# Patient Record
Sex: Male | Born: 1960
Health system: Southern US, Community
[De-identification: ages and names within clinical notes are randomized; demographics above are authoritative.]

## PROBLEM LIST (undated history)

## (undated) DIAGNOSIS — K219 Gastro-esophageal reflux disease without esophagitis: Secondary | ICD-10-CM

## (undated) DIAGNOSIS — E785 Hyperlipidemia, unspecified: Secondary | ICD-10-CM

## (undated) DIAGNOSIS — I10 Essential (primary) hypertension: Secondary | ICD-10-CM

## (undated) HISTORY — PX: OTHER SURGICAL HISTORY: SHX169

## (undated) HISTORY — DX: Gastro-esophageal reflux disease without esophagitis: K21.9

## (undated) HISTORY — DX: Essential (primary) hypertension: I10

## (undated) HISTORY — DX: Hyperlipidemia, unspecified: E78.5

---

## 2000-08-14 ENCOUNTER — Ambulatory Visit (HOSPITAL_COMMUNITY): Admission: RE | Admit: 2000-08-14 | Discharge: 2000-08-14 | Payer: Self-pay | Admitting: Unknown Physician Specialty

## 2000-08-14 ENCOUNTER — Encounter: Payer: Self-pay | Admitting: Unknown Physician Specialty

## 2001-06-22 ENCOUNTER — Emergency Department (HOSPITAL_COMMUNITY): Admission: EM | Admit: 2001-06-22 | Discharge: 2001-06-22 | Payer: Self-pay | Admitting: Emergency Medicine

## 2007-03-11 ENCOUNTER — Ambulatory Visit (HOSPITAL_COMMUNITY): Admission: RE | Admit: 2007-03-11 | Discharge: 2007-03-11 | Payer: Self-pay | Admitting: Family Medicine
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
## 2012-06-04 ENCOUNTER — Encounter: Payer: Self-pay | Admitting: Cardiology

## 2012-06-26 ENCOUNTER — Institutional Professional Consult (permissible substitution): Payer: Self-pay | Admitting: Cardiology

## 2013-06-23 IMAGING — CR DG CHEST 2V
2 series · 2 of 2 positions shown · non-contrast
Comparison: None.

CLINICAL DATA: Shortness of breath

CHEST - 2 VIEW

[view not recorded (1 of 2)]
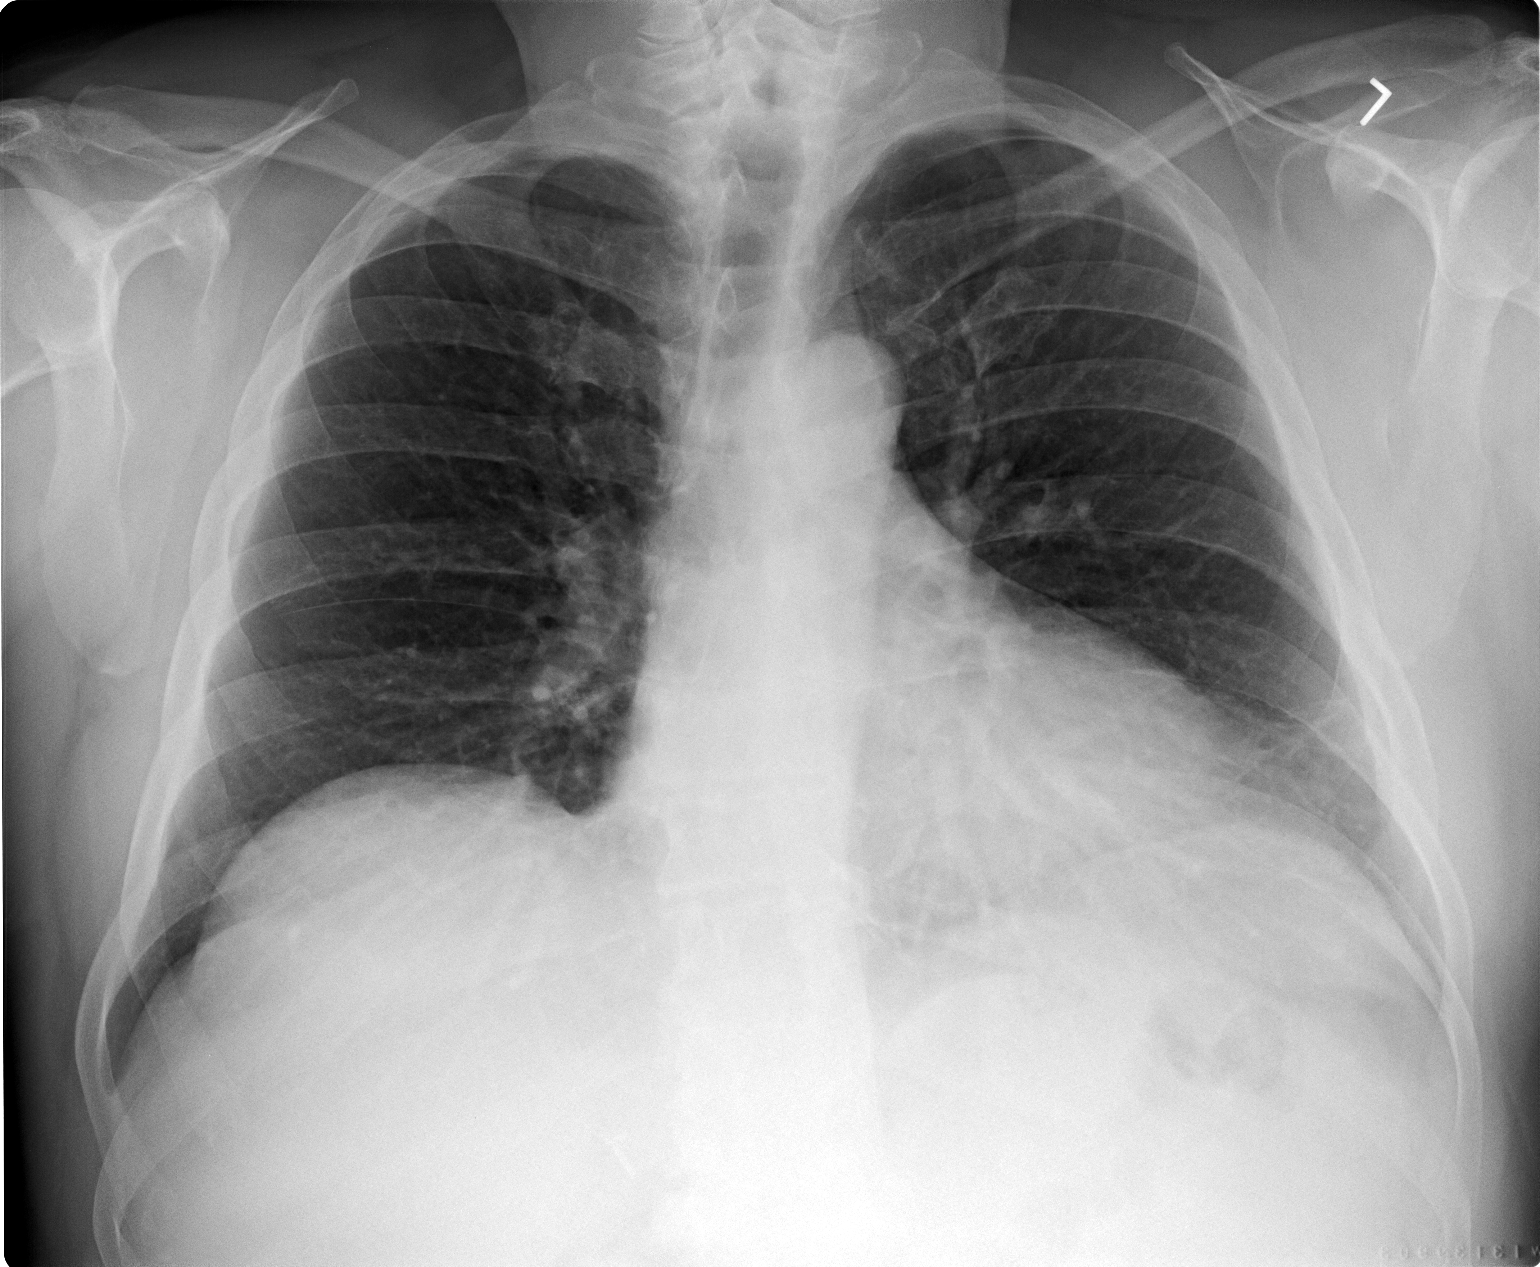

[view not recorded (2 of 2)]
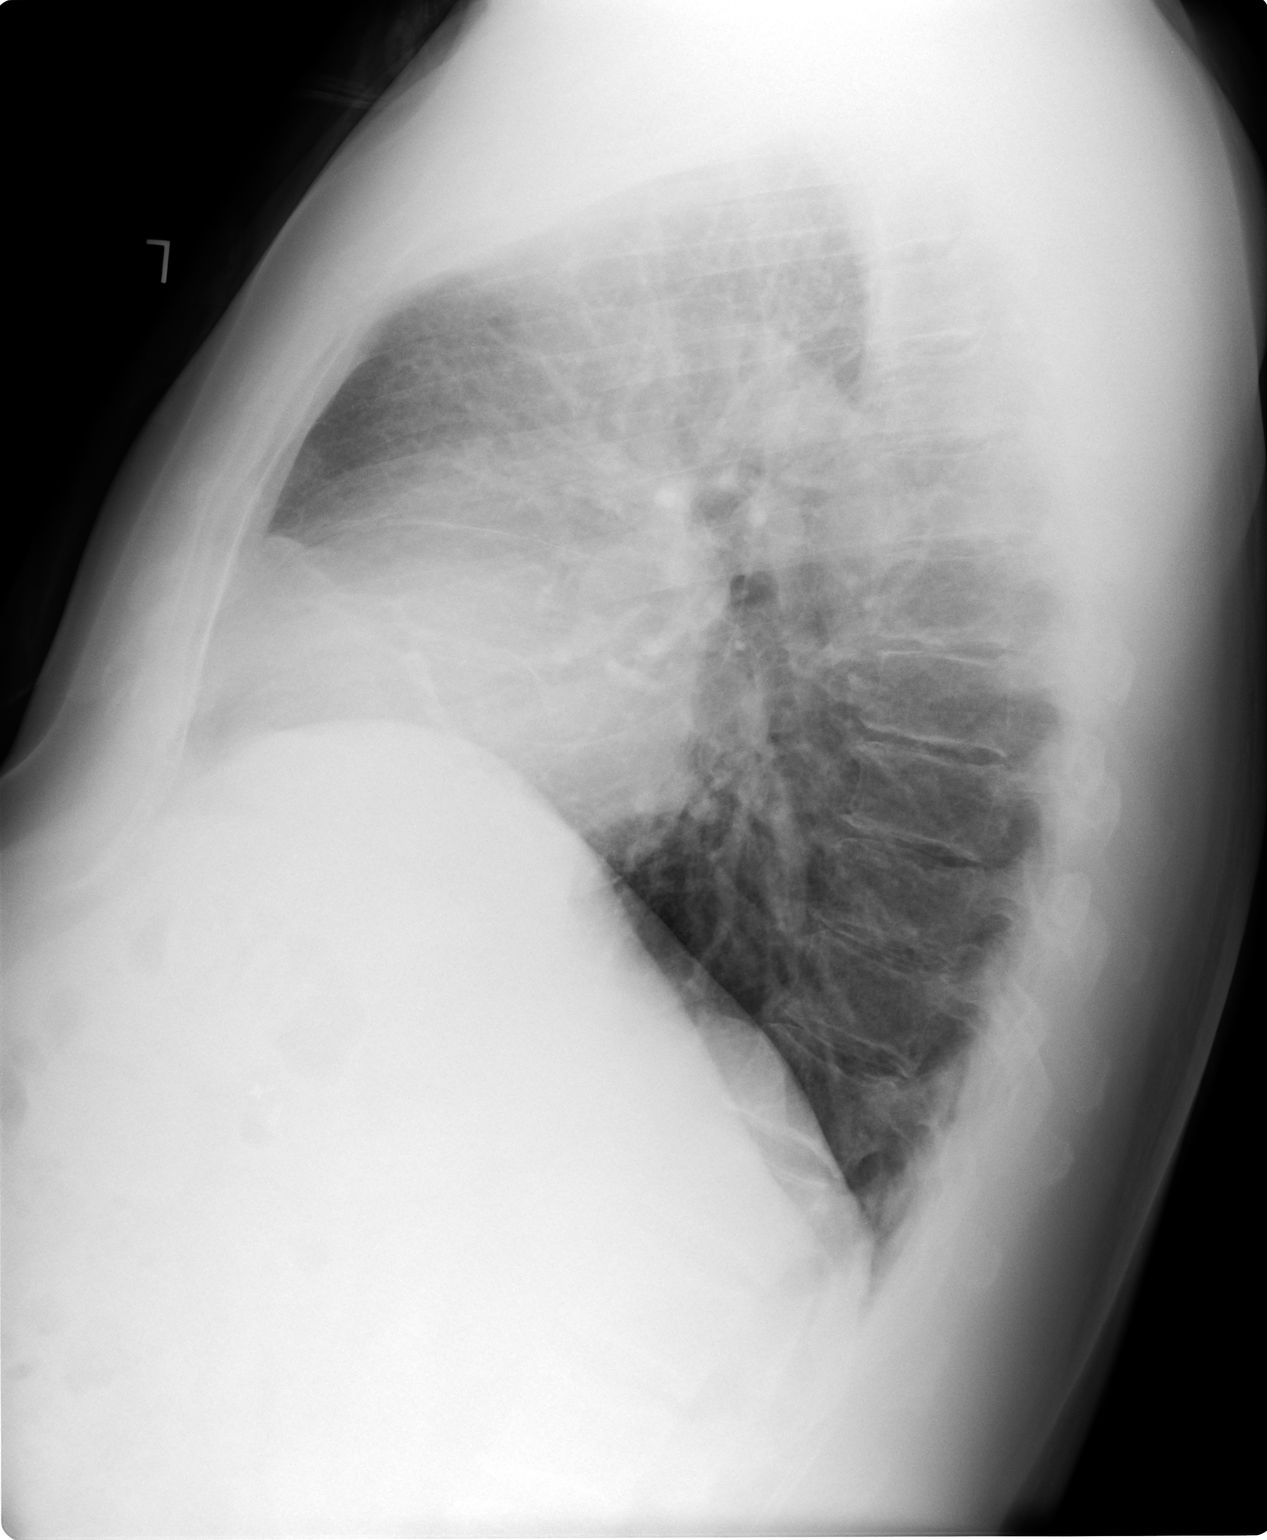

[2 of 2 positions shown; findings below may reference images not displayed]

FINDINGS: There is slight scarring in the left base.  Lungs are
otherwise clear.  Heart size and pulmonary vascularity are normal.
No adenopathy.  No bone lesions.
IMPRESSION: No edema or consolidation.

## 2015-06-01 ENCOUNTER — Encounter: Payer: Self-pay | Admitting: Family Medicine

## 2015-06-01 ENCOUNTER — Ambulatory Visit (INDEPENDENT_AMBULATORY_CARE_PROVIDER_SITE_OTHER): Payer: BLUE CROSS/BLUE SHIELD | Admitting: Family Medicine

## 2015-06-01 VITALS — BP 131/83 | HR 68 | Temp 98.6°F | Ht 70.0 in | Wt 193.0 lb

## 2015-06-01 DIAGNOSIS — J0100 Acute maxillary sinusitis, unspecified: Secondary | ICD-10-CM | POA: Diagnosis not present

## 2015-06-01 DIAGNOSIS — R05 Cough: Secondary | ICD-10-CM

## 2015-06-01 DIAGNOSIS — R059 Cough, unspecified: Secondary | ICD-10-CM

## 2015-06-01 LAB — POCT INFLUENZA A/B
INFLUENZA A, POC: NEGATIVE
Influenza B, POC: NEGATIVE

## 2015-06-01 MED ORDER — AMOXICILLIN-POT CLAVULANATE 875-125 MG PO TABS: 1.0000 | ORAL_TABLET | Freq: Two times a day (BID) | ORAL | Status: DC

## 2015-06-01 MED ORDER — PSEUDOEPHEDRINE-GUAIFENESIN ER 120-1200 MG PO TB12: 1.0000 | ORAL_TABLET | Freq: Two times a day (BID) | ORAL | Status: DC

## 2015-06-01 MED ORDER — BENZONATATE 200 MG PO CAPS: 200.0000 mg | ORAL_CAPSULE | Freq: Three times a day (TID) | ORAL | Status: DC | PRN
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
## 2015-06-04 ENCOUNTER — Telehealth: Payer: Self-pay | Admitting: Family Medicine

## 2015-06-04 NOTE — Telephone Encounter (Signed)
Seen stacks on 10th - is it ok to approve this many days?

## 2015-06-04 NOTE — Telephone Encounter (Signed)
Please review and advise.

## 2015-06-04 NOTE — Telephone Encounter (Signed)
Printed for signature - pt aware

## 2015-06-04 NOTE — Telephone Encounter (Signed)
Okay to write & I will sign. Thanks, WS

## 2015-06-10 ENCOUNTER — Telehealth: Payer: Self-pay | Admitting: Family Medicine

## 2015-06-10 MED ORDER — AMOXICILLIN-POT CLAVULANATE 875-125 MG PO TABS: 1.0000 | ORAL_TABLET | Freq: Two times a day (BID) | ORAL | Status: DC

## 2015-06-10 NOTE — Telephone Encounter (Signed)
In general I think he should be seen if not better after a course of augmentin.   Murtis Sink, MD [facility] 06/10/2015, 12:46 PM

## 2015-06-10 NOTE — Addendum Note (Signed)
Addended by: Mechele Claude on: 06/10/2015 03:29 PM   Modules accepted: Orders

## 2015-06-10 NOTE — Telephone Encounter (Signed)
His sinuses were pretty bad. Sometimes it takes just a little extra to calm them down.I went ahead with the refil - just this once.

## 2015-06-10 NOTE — Telephone Encounter (Signed)
Patient aware.

## 2015-06-10 NOTE — Telephone Encounter (Signed)
Patient aware and states he does not have the money to come in and would still like and another antibiotic. He asked if I would send this to you to review.

## 2015-07-06 ENCOUNTER — Telehealth: Payer: Self-pay | Admitting: Family Medicine

## 2015-07-06 NOTE — Telephone Encounter (Signed)
Stp and advised to use mucinex, zyrtec and saline nasal spray. Increase water intake. Pt voiced understanding.

## 2015-11-19 DIAGNOSIS — S30860A Insect bite (nonvenomous) of lower back and pelvis, initial encounter: Secondary | ICD-10-CM | POA: Diagnosis not present

## 2015-11-19 DIAGNOSIS — W57XXXA Bitten or stung by nonvenomous insect and other nonvenomous arthropods, initial encounter: Secondary | ICD-10-CM | POA: Diagnosis not present

## 2015-11-19 DIAGNOSIS — R509 Fever, unspecified: Secondary | ICD-10-CM | POA: Diagnosis not present

## 2016-03-01 ENCOUNTER — Telehealth: Payer: Self-pay | Admitting: Family Medicine

## 2016-03-01 NOTE — Telephone Encounter (Signed)
appt scheduled for CPE per pt request

## 2016-03-07 ENCOUNTER — Encounter: Payer: Self-pay | Admitting: Family Medicine

## 2016-03-07 ENCOUNTER — Ambulatory Visit (INDEPENDENT_AMBULATORY_CARE_PROVIDER_SITE_OTHER): Payer: BLUE CROSS/BLUE SHIELD | Admitting: Family Medicine

## 2016-03-07 VITALS — BP 126/78 | HR 54 | Temp 98.1°F | Ht 70.0 in | Wt 191.4 lb

## 2016-03-07 DIAGNOSIS — I10 Essential (primary) hypertension: Secondary | ICD-10-CM

## 2016-03-07 DIAGNOSIS — Z0000 Encounter for general adult medical examination without abnormal findings: Secondary | ICD-10-CM | POA: Diagnosis not present

## 2016-03-07 DIAGNOSIS — Z23 Encounter for immunization: Secondary | ICD-10-CM | POA: Diagnosis not present

## 2016-03-07 DIAGNOSIS — E784 Other hyperlipidemia: Secondary | ICD-10-CM | POA: Diagnosis not present

## 2016-03-07 DIAGNOSIS — E7849 Other hyperlipidemia: Secondary | ICD-10-CM

## 2016-03-07 LAB — URINALYSIS
Bilirubin, UA: NEGATIVE
Glucose, UA: NEGATIVE
KETONES UA: NEGATIVE
LEUKOCYTES UA: NEGATIVE
NITRITE UA: NEGATIVE
PH UA: 8.5 — AB (ref 5.0–7.5)
Protein, UA: NEGATIVE
RBC, UA: NEGATIVE
Specific Gravity, UA: 1.015 (ref 1.005–1.030)
UUROB: 1 mg/dL (ref 0.2–1.0)

## 2016-03-07 MED ORDER — METOPROLOL TARTRATE 25 MG PO TABS: 25.0000 mg | ORAL_TABLET | Freq: Two times a day (BID) | ORAL | 3 refills | Status: DC

## 2016-03-07 MED ORDER — ATORVASTATIN CALCIUM 80 MG PO TABS: 80.0000 mg | ORAL_TABLET | Freq: Every day | ORAL | 3 refills | Status: DC

## 2016-03-07 MED ORDER — LISINOPRIL-HYDROCHLOROTHIAZIDE 10-12.5 MG PO TABS: 1.0000 | ORAL_TABLET | ORAL | 3 refills | Status: DC

## 2016-03-07 MED ORDER — OMEPRAZOLE 40 MG PO CPDR: 40.0000 mg | DELAYED_RELEASE_CAPSULE | Freq: Every day | ORAL | 3 refills | Status: DC
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
## 2016-03-08 LAB — CMP14+EGFR
A/G RATIO: 1.6 (ref 1.2–2.2)
ALBUMIN: 4.3 g/dL (ref 3.5–5.5)
ALK PHOS: 58 IU/L (ref 39–117)
ALT: 23 IU/L (ref 0–44)
AST: 26 IU/L (ref 0–40)
BUN / CREAT RATIO: 19 (ref 9–20)
BUN: 14 mg/dL (ref 6–24)
Bilirubin Total: 1 mg/dL (ref 0.0–1.2)
CO2: 27 mmol/L (ref 18–29)
Calcium: 9.8 mg/dL (ref 8.7–10.2)
Chloride: 99 mmol/L (ref 96–106)
Creatinine, Ser: 0.75 mg/dL — ABNORMAL LOW (ref 0.76–1.27)
GFR calc Af Amer: 119 mL/min/{1.73_m2} (ref >59)
GFR, EST NON AFRICAN AMERICAN: 103 mL/min/{1.73_m2} (ref >59)
GLOBULIN, TOTAL: 2.7 g/dL (ref 1.5–4.5)
Glucose: 87 mg/dL (ref 65–99)
POTASSIUM: 4.4 mmol/L (ref 3.5–5.2)
SODIUM: 141 mmol/L (ref 134–144)
Total Protein: 7 g/dL (ref 6.0–8.5)

## 2016-03-08 LAB — LIPID PANEL
CHOL/HDL RATIO: 3.1 ratio (ref 0.0–5.0)
Cholesterol, Total: 113 mg/dL (ref 100–199)
HDL: 37 mg/dL — ABNORMAL LOW (ref >39)
LDL CALC: 60 mg/dL (ref 0–99)
TRIGLYCERIDES: 79 mg/dL (ref 0–149)
VLDL Cholesterol Cal: 16 mg/dL (ref 5–40)

## 2016-03-08 LAB — PSA TOTAL (REFLEX TO FREE): Prostate Specific Ag, Serum: 0.7 ng/mL (ref 0.0–4.0)

## 2016-04-04 ENCOUNTER — Telehealth: Payer: Self-pay | Admitting: Family Medicine

## 2016-04-04 NOTE — Telephone Encounter (Signed)
NTBS.

## 2016-04-04 NOTE — Telephone Encounter (Signed)
Last seen 03/07/16  Dr Darlyn ReadStacks

## 2016-04-05 NOTE — Telephone Encounter (Signed)
Patient aware and states he will call back to make appointment if it does not get better.

## 2016-04-06 DIAGNOSIS — H524 Presbyopia: Secondary | ICD-10-CM | POA: Diagnosis not present

## 2016-04-06 DIAGNOSIS — H52202 Unspecified astigmatism, left eye: Secondary | ICD-10-CM | POA: Diagnosis not present

## 2016-04-06 DIAGNOSIS — H5202 Hypermetropia, left eye: Secondary | ICD-10-CM | POA: Diagnosis not present

## 2016-05-03 ENCOUNTER — Ambulatory Visit (INDEPENDENT_AMBULATORY_CARE_PROVIDER_SITE_OTHER): Payer: BLUE CROSS/BLUE SHIELD | Admitting: Family Medicine

## 2016-05-03 ENCOUNTER — Encounter: Payer: Self-pay | Admitting: Family Medicine

## 2016-05-03 VITALS — BP 125/76 | HR 65 | Temp 97.3°F | Ht 70.0 in | Wt 195.0 lb

## 2016-05-03 DIAGNOSIS — J0100 Acute maxillary sinusitis, unspecified: Secondary | ICD-10-CM | POA: Diagnosis not present

## 2016-05-03 MED ORDER — PSEUDOEPHEDRINE-GUAIFENESIN ER 120-1200 MG PO TB12: 1.0000 | ORAL_TABLET | Freq: Two times a day (BID) | ORAL | 0 refills | Status: DC

## 2016-05-03 MED ORDER — AMOXICILLIN-POT CLAVULANATE 875-125 MG PO TABS: 1.0000 | ORAL_TABLET | Freq: Two times a day (BID) | ORAL | 0 refills | Status: DC

## 2016-05-03 NOTE — Progress Notes (Signed)
Subjective:  Patient ID: Willie Brown, male    DOB: 1960-07-02  Age: 55 y.o. MRN: [id number]  CC: Sinusitis (pt here today c/o runny nose, headache, sinus pressure, ears popping)   HPI Willie Brown presents for Symptoms include congestion, facial pain, nasal congestion, no  fever, non productive cough, post nasal drip and sinus pressure with no fever, chills, night sweats or weight loss. Onset of symptoms was a few days ago, gradually worsening since that time. Pt.is drinking moderate amounts of fluids.     History Willie Brown has no past medical history on file.   He has no past surgical history on file.   His family history is not on file.He reports that he has never smoked. He has never used smokeless tobacco. He reports that he does not drink alcohol or use drugs.  Current Outpatient Prescriptions on File Prior to Visit  Medication Sig Dispense Refill  . aspirin EC 81 MG tablet Take 81 mg by mouth.    Marland Kitchen. atorvastatin (LIPITOR) 80 MG tablet Take 1 tablet (80 mg total) by mouth daily. 90 tablet 3  . Cholecalciferol (VITAMIN D3) 2000 units capsule Take by mouth.    Marland Kitchen. lisinopril-hydrochlorothiazide (PRINZIDE,ZESTORETIC) 10-12.5 MG tablet Take 1 tablet by mouth every morning. 90 tablet 3  . metoprolol tartrate (LOPRESSOR) 25 MG tablet Take 1 tablet (25 mg total) by mouth 2 (two) times daily. 180 tablet 3  . Multiple Vitamin (MULTIVITAMIN) capsule Take by mouth.    . nitroGLYCERIN (NITROSTAT) 0.4 MG SL tablet Place 0.4 mg under the tongue.    . Omega-3 1000 MG CAPS Take 1 g by mouth.    Marland Kitchen. omeprazole (PRILOSEC) 40 MG capsule Take 1 capsule (40 mg total) by mouth daily. 90 capsule 3  . vitamin C (ASCORBIC ACID) 500 MG tablet Take 500 mg by mouth.     No current facility-administered medications on file prior to visit.     ROS Review of Systems  Constitutional: Positive for fatigue and fever. Negative for activity change, appetite change and chills.  HENT: Positive for  congestion, postnasal drip, rhinorrhea and sinus pressure. Negative for ear discharge, ear pain, hearing loss, nosebleeds, sneezing and trouble swallowing.   Respiratory: Negative for chest tightness and shortness of breath.   Cardiovascular: Negative for chest pain and palpitations.  Skin: Negative for rash.    Objective:  BP 125/76   Pulse 65   Temp 97.3 F (36.3 C) (Oral)   Ht 5\' 10"  (1.778 m)   Wt 195 lb (88.5 kg)   BMI 27.98 kg/m   Physical Exam  Constitutional: He appears well-developed and well-nourished.  HENT:  Head: Normocephalic and atraumatic.  Right Ear: Tympanic membrane and external ear normal. No decreased hearing is noted.  Left Ear: Tympanic membrane and external ear normal. No decreased hearing is noted.  Nose: Mucosal edema present. Right sinus exhibits no frontal sinus tenderness. Left sinus exhibits no frontal sinus tenderness.  Mouth/Throat: No oropharyngeal exudate or posterior oropharyngeal erythema.  Neck: No Brudzinski's sign noted.  Pulmonary/Chest: Breath sounds normal. No respiratory distress.  Lymphadenopathy:       Head (right side): No preauricular adenopathy present.       Head (left side): No preauricular adenopathy present.       Right cervical: No superficial cervical adenopathy present.      Left cervical: No superficial cervical adenopathy present.    Assessment & Plan:   Willie Brown was seen today for sinusitis.  Diagnoses and all  orders for this visit:  Acute maxillary sinusitis, recurrence not specified  Other orders -     amoxicillin-clavulanate (AUGMENTIN) 875-125 MG tablet; Take 1 tablet by mouth 2 (two) times daily. Take all of this medication -     Pseudoephedrine-Guaifenesin [phone] MG TB12; Take 1 tablet by mouth 2 (two) times daily. For congestion   I am having Willie Brown start on amoxicillin-clavulanate and Pseudoephedrine-Guaifenesin. I am also having him maintain his aspirin EC, nitroGLYCERIN, Omega-3, vitamin C, Vitamin  D3, multivitamin, atorvastatin, lisinopril-hydrochlorothiazide, metoprolol tartrate, and omeprazole.  Meds ordered this encounter  Medications  . amoxicillin-clavulanate (AUGMENTIN) 875-125 MG tablet    Sig: Take 1 tablet by mouth 2 (two) times daily. Take all of this medication    Dispense:  20 tablet    Refill:  0  . Pseudoephedrine-Guaifenesin [phone] MG TB12    Sig: Take 1 tablet by mouth 2 (two) times daily. For congestion    Dispense:  20 each    Refill:  0     Follow-up: Return if symptoms worsen or fail to improve.  Mechele ClaudeWarren Cartrell Bentsen, M.D.

## 2016-05-16 ENCOUNTER — Other Ambulatory Visit: Payer: Self-pay | Admitting: Family Medicine

## 2016-05-17 ENCOUNTER — Other Ambulatory Visit: Payer: Self-pay | Admitting: Family Medicine

## 2016-05-18 ENCOUNTER — Telehealth: Payer: Self-pay | Admitting: Family Medicine

## 2016-05-18 MED ORDER — AMOXICILLIN-POT CLAVULANATE 875-125 MG PO TABS: 1.0000 | ORAL_TABLET | Freq: Two times a day (BID) | ORAL | 0 refills | Status: DC

## 2016-05-18 NOTE — Telephone Encounter (Signed)
Patient seen Dr. Darlyn ReadStacks 05/03/16 and was given Augmetin. Patient states the he is still having nasal congestion, cough and facial pain. Patient states he is a little better and was a little better when he was on rx but now is off and is requesting a refill to help get rid of it. Covering PCP, please advise and send back to the pools.

## 2016-05-18 NOTE — Telephone Encounter (Signed)
Patient aware.

## 2016-05-18 NOTE — Telephone Encounter (Signed)
Patient aware and states he does not have the money to came back in. Patient states that it always takes two courses of antibiotic to get better. Please review

## 2016-05-18 NOTE — Telephone Encounter (Signed)
Needs to be seen for antibiotic Rx. Sometimes takes 2-3 weeks to feel back to normal, as long as not getting worse he should continue to improve without further antibiotics. He should use flonase, sinus rinses with netipot, anithistamines such as cetirizine/zyrtec to help with remaining nasal congestion.

## 2016-05-18 NOTE — Telephone Encounter (Signed)
Sent in 5 additional days. If not getting gbetter needs to be seen. Should also try below: Netipot or similar for sinus rinses. with distilled water/salt water solution 2-3 times a day to clear out sinuses Flonase steroid nasal spray Antihistamine such as cetirizine or zyrtec daily

## 2016-05-18 NOTE — Telephone Encounter (Signed)
See other phone note from today, sent in additional 5 days

## 2016-05-19 ENCOUNTER — Telehealth: Payer: Self-pay | Admitting: Family Medicine

## 2016-05-19 MED ORDER — AMOXICILLIN-POT CLAVULANATE 875-125 MG PO TABS: 1.0000 | ORAL_TABLET | Freq: Two times a day (BID) | ORAL | 0 refills | Status: DC

## 2016-05-19 NOTE — Telephone Encounter (Signed)
Rx sent to pharmacy. Patient aware. 

## 2016-05-19 NOTE — Telephone Encounter (Signed)
Can you call pt? I sent this in yesterday

## 2017-02-22 ENCOUNTER — Ambulatory Visit (INDEPENDENT_AMBULATORY_CARE_PROVIDER_SITE_OTHER): Payer: BLUE CROSS/BLUE SHIELD

## 2017-02-22 ENCOUNTER — Ambulatory Visit: Payer: BLUE CROSS/BLUE SHIELD

## 2017-02-22 DIAGNOSIS — Z23 Encounter for immunization: Secondary | ICD-10-CM | POA: Diagnosis not present

## 2017-03-06 ENCOUNTER — Other Ambulatory Visit: Payer: Self-pay | Admitting: Family Medicine

## 2017-03-13 ENCOUNTER — Other Ambulatory Visit: Payer: Self-pay | Admitting: Family Medicine

## 2017-03-15 NOTE — Telephone Encounter (Signed)
OV 03/22/17 

## 2017-03-22 ENCOUNTER — Encounter: Payer: Self-pay | Admitting: Family Medicine

## 2017-03-22 ENCOUNTER — Ambulatory Visit (INDEPENDENT_AMBULATORY_CARE_PROVIDER_SITE_OTHER): Payer: BLUE CROSS/BLUE SHIELD | Admitting: Family Medicine

## 2017-03-22 VITALS — BP 129/82 | HR 58 | Temp 97.5°F | Ht 70.0 in | Wt 190.0 lb

## 2017-03-22 DIAGNOSIS — I10 Essential (primary) hypertension: Secondary | ICD-10-CM | POA: Diagnosis not present

## 2017-03-22 DIAGNOSIS — R310 Gross hematuria: Secondary | ICD-10-CM | POA: Insufficient documentation

## 2017-03-22 DIAGNOSIS — Z0000 Encounter for general adult medical examination without abnormal findings: Secondary | ICD-10-CM | POA: Diagnosis not present

## 2017-03-22 DIAGNOSIS — Z125 Encounter for screening for malignant neoplasm of prostate: Secondary | ICD-10-CM | POA: Diagnosis not present

## 2017-03-22 DIAGNOSIS — E7849 Other hyperlipidemia: Secondary | ICD-10-CM

## 2017-03-22 DIAGNOSIS — E559 Vitamin D deficiency, unspecified: Secondary | ICD-10-CM | POA: Diagnosis not present

## 2017-03-22 DIAGNOSIS — K219 Gastro-esophageal reflux disease without esophagitis: Secondary | ICD-10-CM

## 2017-03-22 LAB — URINALYSIS
Bilirubin, UA: NEGATIVE
Glucose, UA: NEGATIVE
KETONES UA: NEGATIVE
Leukocytes, UA: NEGATIVE
NITRITE UA: NEGATIVE
PH UA: 8 — AB (ref 5.0–7.5)
Protein, UA: NEGATIVE
RBC, UA: NEGATIVE
Specific Gravity, UA: 1.015 (ref 1.005–1.030)
UUROB: 0.2 mg/dL (ref 0.2–1.0)

## 2017-03-22 MED ORDER — METOPROLOL TARTRATE 25 MG PO TABS: 25.0000 mg | ORAL_TABLET | Freq: Two times a day (BID) | ORAL | 3 refills | Status: DC

## 2017-03-22 MED ORDER — OMEPRAZOLE 40 MG PO CPDR: 40.0000 mg | DELAYED_RELEASE_CAPSULE | Freq: Every day | ORAL | 3 refills | Status: DC

## 2017-03-22 MED ORDER — ATORVASTATIN CALCIUM 80 MG PO TABS: 80.0000 mg | ORAL_TABLET | Freq: Every day | ORAL | 3 refills | Status: DC

## 2017-03-22 MED ORDER — LISINOPRIL-HYDROCHLOROTHIAZIDE 10-12.5 MG PO TABS: 1.0000 | ORAL_TABLET | ORAL | 3 refills | Status: DC

## 2017-03-22 NOTE — Progress Notes (Signed)
Subjective:  Patient ID: Willie Brown, male    DOB: 13-Jan-1961  Age: 56 y.o. MRN: [id number]  CC: Annual Exam (pt here today for CPE, no concerns voiced)   HPI [name] presents for Complete physical exam.  Patient in for follow-up of elevated cholesterol. Doing well without complaints on current medication. Denies side effects of statin including myalgia and arthralgia and nausea. Also in today for liver function testing. Currently no chest pain, shortness of breath or other cardiovascular related symptoms noted.   follow-up of hypertension. Patient has no history of headache chest pain or shortness of breath or recent cough. Patient also denies symptoms of TIA such as numbness weakness lateralizing. Patient checks  blood pressure at home and has not had any elevated readings recently. Patient denies side effects from his medication. States taking it regularly.   Patient in for follow-up of GERD. Currently asymptomatic taking  PPI daily. There is no chest pain or heartburn. No hematemesis and no melena. No dysphagia or choking. Onset is remote. Progression is stable. Complicating factors, none.   Depression screen [facility] 2/9 03/22/2017 05/03/2016 03/07/2016  Decreased Interest 0 0 0  Down, Depressed, Hopeless 0 0 0  PHQ - 2 Score 0 0 0    History Khaliq has no past medical history on file.   He has no past surgical history on file.   His family history is not on file.He reports that he has never smoked. He has never used smokeless tobacco. He reports that he does not drink alcohol or use drugs.    ROS Review of Systems  Constitutional: Negative for activity change, appetite change, chills, diaphoresis, fatigue, fever and unexpected weight change.  HENT: Negative for congestion, ear pain, hearing loss, postnasal drip, rhinorrhea, sore throat, tinnitus and trouble swallowing.   Eyes: Negative for photophobia, pain, discharge, redness and visual disturbance.    Respiratory: Negative for apnea, cough, choking, chest tightness, shortness of breath, wheezing and stridor.   Cardiovascular: Negative for chest pain, palpitations and leg swelling.  Gastrointestinal: Negative for abdominal distention, abdominal pain, blood in stool, constipation, diarrhea, nausea and vomiting.  Endocrine: Negative for cold intolerance, heat intolerance, polydipsia, polyphagia and polyuria.  Genitourinary: Positive for hematuria (2 episodes noted fairly close together most recent was 3 months ago. No symptoms since that time). Negative for difficulty urinating, dysuria, enuresis, flank pain, frequency, genital sores and urgency.  Musculoskeletal: Negative for arthralgias and joint swelling.  Skin: Negative for color change, rash and wound.  Allergic/Immunologic: Negative for immunocompromised state.  Neurological: Negative for dizziness, tremors, seizures, syncope, facial asymmetry, speech difficulty, weakness, light-headedness, numbness and headaches.  Hematological: Does not bruise/bleed easily.  Psychiatric/Behavioral: Negative for agitation, behavioral problems, confusion, decreased concentration, dysphoric mood, hallucinations, sleep disturbance and suicidal ideas. The patient is not nervous/anxious and is not hyperactive.     Objective:  BP 129/82   Pulse (!) 58   Temp (!) 97.5 F (36.4 C) (Oral)   Ht '5\' 10"'$  (1.778 m)   Wt 190 lb (86.2 kg)   BMI 27.26 kg/m   BP Readings from Last 3 Encounters:  03/22/17 129/82  05/03/16 125/76  03/07/16 126/78    Wt Readings from Last 3 Encounters:  03/22/17 190 lb (86.2 kg)  05/03/16 195 lb (88.5 kg)  03/07/16 191 lb 6 oz (86.8 kg)     Physical Exam  Constitutional: He is oriented to person, place, and time. He appears well-developed and well-nourished.  HENT:  Head: Normocephalic and  atraumatic.  Mouth/Throat: Oropharynx is clear and moist.  Eyes: Pupils are equal, round, and reactive to light. EOM are normal.   Neck: Normal range of motion. No tracheal deviation present. No thyromegaly present.  Cardiovascular: Normal rate, regular rhythm and normal heart sounds.  Exam reveals no gallop and no friction rub.   No murmur heard. Pulmonary/Chest: Breath sounds normal. He has no wheezes. He has no rales.  Abdominal: Soft. He exhibits no mass. There is no tenderness.  Musculoskeletal: Normal range of motion. He exhibits no edema.  Neurological: He is alert and oriented to person, place, and time.  Skin: Skin is warm and dry.  Psychiatric: He has a normal mood and affect.      Assessment & Plan:   Lasean was seen today for annual exam.  Diagnoses and all orders for this visit:  Well adult exam -     CBC with Differential/Platelet -     CMP14+EGFR -     Lipid panel -     PSA, total and free -     VITAMIN D 25 Hydroxy (Vit-D Deficiency, Fractures) -     Urinalysis  Other hyperlipidemia -     Lipid panel -     atorvastatin (LIPITOR) 80 MG tablet; Take 1 tablet (80 mg total) by mouth daily.  Benign essential HTN -     CBC with Differential/Platelet -     CMP14+EGFR -     lisinopril-hydrochlorothiazide (PRINZIDE,ZESTORETIC) 10-12.5 MG tablet; Take 1 tablet by mouth every morning. -     metoprolol tartrate (LOPRESSOR) 25 MG tablet; Take 1 tablet (25 mg total) by mouth 2 (two) times daily.  Vitamin D deficiency -     VITAMIN D 25 Hydroxy (Vit-D Deficiency, Fractures)  Screening for prostate cancer -     PSA, total and free  Gross hematuria -     Urinalysis  Gastroesophageal reflux disease without esophagitis -     omeprazole (PRILOSEC) 40 MG capsule; Take 1 capsule (40 mg total) by mouth daily.       I have discontinued Mr. Mateus's lisinopril-hydrochlorothiazide, Pseudoephedrine-Guaifenesin, and amoxicillin-clavulanate. I have also changed his lisinopril-hydrochlorothiazide, metoprolol tartrate, and omeprazole. Additionally, I am having him maintain his aspirin EC,  nitroGLYCERIN, Omega-3, vitamin C, Vitamin D3, multivitamin, and atorvastatin.  Allergies as of 03/22/2017      Reactions   Codeine Nausea And Vomiting   Doxycycline Nausea And Vomiting      Medication List       Accurate as of 03/22/17  2:12 PM. Always use your most recent med list.          aspirin EC 81 MG tablet Take 81 mg by mouth.   atorvastatin 80 MG tablet Commonly known as:  LIPITOR Take 1 tablet (80 mg total) by mouth daily.   lisinopril-hydrochlorothiazide 10-12.5 MG tablet Commonly known as:  PRINZIDE,ZESTORETIC Take 1 tablet by mouth every morning.   metoprolol tartrate 25 MG tablet Commonly known as:  LOPRESSOR Take 1 tablet (25 mg total) by mouth 2 (two) times daily.   multivitamin capsule Take by mouth.   nitroGLYCERIN 0.4 MG SL tablet Commonly known as:  NITROSTAT Place 0.4 mg under the tongue.   Omega-3 1000 MG Caps Take 1 g by mouth.   omeprazole 40 MG capsule Commonly known as:  PRILOSEC Take 1 capsule (40 mg total) by mouth daily.   vitamin C 500 MG tablet Commonly known as:  ASCORBIC ACID Take 500 mg by mouth.   Vitamin  D3 2000 units capsule Take by mouth.        Follow-up: Return in about 6 months (around 09/19/2017).  Claretta Fraise, M.D.

## 2017-03-23 LAB — CMP14+EGFR
A/G RATIO: 1.7 (ref 1.2–2.2)
ALT: 27 IU/L (ref 0–44)
AST: 28 IU/L (ref 0–40)
Albumin: 4.3 g/dL (ref 3.5–5.5)
Alkaline Phosphatase: 58 IU/L (ref 39–117)
BUN/Creatinine Ratio: 13 (ref 9–20)
BUN: 11 mg/dL (ref 6–24)
Bilirubin Total: 0.7 mg/dL (ref 0.0–1.2)
CALCIUM: 9.9 mg/dL (ref 8.7–10.2)
CO2: 27 mmol/L (ref 20–29)
CREATININE: 0.82 mg/dL (ref 0.76–1.27)
Chloride: 100 mmol/L (ref 96–106)
GFR, EST AFRICAN AMERICAN: 114 mL/min/{1.73_m2} (ref >59)
GFR, EST NON AFRICAN AMERICAN: 99 mL/min/{1.73_m2} (ref >59)
Globulin, Total: 2.5 g/dL (ref 1.5–4.5)
Glucose: 91 mg/dL (ref 65–99)
Potassium: 4.7 mmol/L (ref 3.5–5.2)
Sodium: 140 mmol/L (ref 134–144)
TOTAL PROTEIN: 6.8 g/dL (ref 6.0–8.5)

## 2017-03-23 LAB — CBC WITH DIFFERENTIAL/PLATELET
BASOS: 0 %
Basophils Absolute: 0 10*3/uL (ref 0.0–0.2)
EOS (ABSOLUTE): 0.2 10*3/uL (ref 0.0–0.4)
EOS: 3 %
Hematocrit: 44.2 % (ref 37.5–51.0)
Hemoglobin: 15.9 g/dL (ref 13.0–17.7)
IMMATURE GRANS (ABS): 0 10*3/uL (ref 0.0–0.1)
IMMATURE GRANULOCYTES: 0 %
LYMPHS: 35 %
Lymphocytes Absolute: 2.1 10*3/uL (ref 0.7–3.1)
MCH: 31.5 pg (ref 26.6–33.0)
MCHC: 36 g/dL — ABNORMAL HIGH (ref 31.5–35.7)
MCV: 88 fL (ref 79–97)
MONOS ABS: 0.6 10*3/uL (ref 0.1–0.9)
Monocytes: 10 %
NEUTROS PCT: 52 %
Neutrophils Absolute: 3.1 10*3/uL (ref 1.4–7.0)
PLATELETS: 252 10*3/uL (ref 150–379)
RBC: 5.04 x10E6/uL (ref 4.14–5.80)
RDW: 13.4 % (ref 12.3–15.4)
WBC: 6 10*3/uL (ref 3.4–10.8)

## 2017-03-23 LAB — LIPID PANEL
CHOL/HDL RATIO: 2.5 ratio (ref 0.0–5.0)
Cholesterol, Total: 103 mg/dL (ref 100–199)
HDL: 41 mg/dL (ref >39)
LDL CALC: 47 mg/dL (ref 0–99)
TRIGLYCERIDES: 74 mg/dL (ref 0–149)
VLDL CHOLESTEROL CAL: 15 mg/dL (ref 5–40)

## 2017-03-23 LAB — PSA, TOTAL AND FREE
PROSTATE SPECIFIC AG, SERUM: 0.8 ng/mL (ref 0.0–4.0)
PSA, Free Pct: 47.5 %
PSA, Free: 0.38 ng/mL

## 2017-03-23 LAB — VITAMIN D 25 HYDROXY (VIT D DEFICIENCY, FRACTURES): VIT D 25 HYDROXY: 57.9 ng/mL (ref 30.0–100.0)

## 2017-03-26 ENCOUNTER — Telehealth: Payer: Self-pay | Admitting: Family Medicine

## 2017-03-27 NOTE — Telephone Encounter (Signed)
mailed

## 2017-05-18 ENCOUNTER — Ambulatory Visit (INDEPENDENT_AMBULATORY_CARE_PROVIDER_SITE_OTHER): Payer: BLUE CROSS/BLUE SHIELD | Admitting: Physician Assistant

## 2017-05-18 ENCOUNTER — Encounter: Payer: Self-pay | Admitting: Physician Assistant

## 2017-05-18 VITALS — BP 139/84 | HR 87 | Temp 99.0°F | Ht 70.0 in | Wt 193.4 lb

## 2017-05-18 DIAGNOSIS — R509 Fever, unspecified: Secondary | ICD-10-CM

## 2017-05-18 DIAGNOSIS — J0110 Acute frontal sinusitis, unspecified: Secondary | ICD-10-CM | POA: Diagnosis not present

## 2017-05-18 LAB — VERITOR FLU A/B WAIVED
Influenza A: NEGATIVE
Influenza B: NEGATIVE

## 2017-05-18 MED ORDER — AMOXICILLIN-POT CLAVULANATE 875-125 MG PO TABS
1.0000 | ORAL_TABLET | Freq: Two times a day (BID) | ORAL | 0 refills | Status: DC
Start: 2017-05-18 — End: 2017-06-03

## 2017-05-18 NOTE — Patient Instructions (Addendum)
In a few days you may receive a survey in the mail or online from [employer] regarding your visit with us today. Please take a moment to fill this out. Your feedback is very important to our whole office. It can help us better understand your needs as well as improve your experience and satisfaction. Thank you for taking your time to complete it. We care about you.  Prudy FeelerAngel Darrian Grzelak, PA-C   DELSYM 3 tsp every 12 hours

## 2017-05-21 NOTE — Progress Notes (Signed)
BP 139/84   Pulse 87   Temp 99 F (37.2 C) (Oral)   Ht 5\' 10"  (1.778 m)   Wt 193 lb 6.4 oz (87.7 kg)   BMI 27.75 kg/m    Subjective:    Patient ID: Willie Brown, male    DOB: [birth year], 56 y.o.   MRN: [id number]  HPI: Willie Hoppingimothy J Sizer is a 56 y.o. male presenting on 05/18/2017 for Cough; Fever; and Emesis  This patient has had many days of sinus headache and postnasal drainage. There is copious drainage at times. Denies any fever at this time. There has been a history of sinus infections in the past.  No history of sinus surgery. There is cough at night. It has become more prevalent in recent days.  Relevant past medical, surgical, family and social history reviewed and updated as indicated. Allergies and medications reviewed and updated.  History reviewed. No pertinent past medical history.  History reviewed. No pertinent surgical history.  Review of Systems  Constitutional: Positive for fatigue. Negative for appetite change.  HENT: Positive for congestion, postnasal drip, sinus pressure, sinus pain and sore throat.   Eyes: Negative.  Negative for pain and visual disturbance.  Respiratory: Positive for shortness of breath and wheezing. Negative for cough and chest tightness.   Cardiovascular: Negative.  Negative for chest pain, palpitations and leg swelling.  Gastrointestinal: Negative.  Negative for abdominal pain, diarrhea, nausea and vomiting.  Endocrine: Negative.   Genitourinary: Negative.   Musculoskeletal: Positive for back pain and myalgias.  Skin: Negative.  Negative for color change and rash.  Neurological: Positive for headaches. Negative for weakness and numbness.  Psychiatric/Behavioral: Negative.     Allergies as of 05/18/2017      Reactions   Codeine Nausea And Vomiting   Doxycycline Nausea And Vomiting      Medication List        Accurate as of 05/18/17 11:59 PM. Always use your most recent med list.          amoxicillin-clavulanate  875-125 MG tablet Commonly known as:  AUGMENTIN Take 1 tablet by mouth 2 (two) times daily.   aspirin EC 81 MG tablet Take 81 mg by mouth.   atorvastatin 80 MG tablet Commonly known as:  LIPITOR Take 1 tablet (80 mg total) by mouth daily.   lisinopril-hydrochlorothiazide 10-12.5 MG tablet Commonly known as:  PRINZIDE,ZESTORETIC Take 1 tablet by mouth every morning.   metoprolol tartrate 25 MG tablet Commonly known as:  LOPRESSOR Take 1 tablet (25 mg total) by mouth 2 (two) times daily.   multivitamin capsule Take by mouth.   nitroGLYCERIN 0.4 MG SL tablet Commonly known as:  NITROSTAT Place 0.4 mg under the tongue.   Omega-3 1000 MG Caps Take 1 g by mouth.   omeprazole 40 MG capsule Commonly known as:  PRILOSEC Take 1 capsule (40 mg total) by mouth daily.   vitamin C 500 MG tablet Commonly known as:  ASCORBIC ACID Take 500 mg by mouth.   Vitamin D3 2000 units capsule Take by mouth.          Objective:    BP 139/84   Pulse 87   Temp 99 F (37.2 C) (Oral)   Ht 5\' 10"  (1.778 m)   Wt 193 lb 6.4 oz (87.7 kg)   BMI 27.75 kg/m   Allergies  Allergen Reactions  . Codeine Nausea And Vomiting  . Doxycycline Nausea And Vomiting    Physical Exam  Constitutional: He is oriented  to person, place, and time. He appears well-developed and well-nourished.  HENT:  Head: Normocephalic and atraumatic.  Right Ear: Tympanic membrane and external ear normal. No middle ear effusion.  Left Ear: Tympanic membrane and external ear normal.  No middle ear effusion.  Nose: Mucosal edema and rhinorrhea present. Right sinus exhibits no maxillary sinus tenderness. Left sinus exhibits no maxillary sinus tenderness.  Mouth/Throat: Uvula is midline. Posterior oropharyngeal erythema present.  Eyes: Conjunctivae and EOM are normal. Pupils are equal, round, and reactive to light. Right eye exhibits no discharge. Left eye exhibits no discharge.  Neck: Normal range of motion.    Cardiovascular: Normal rate, regular rhythm and normal heart sounds.  Pulmonary/Chest: Effort normal and breath sounds normal. No respiratory distress. He has no wheezes.  Abdominal: Soft.  Lymphadenopathy:    He has no cervical adenopathy.  Neurological: He is alert and oriented to person, place, and time.  Skin: Skin is warm and dry.  Psychiatric: He has a normal mood and affect.    Results for orders placed or performed in visit on 05/18/17  Veritor Flu A/B Waived  Result Value Ref Range   Influenza A Negative Negative   Influenza B Negative Negative      Assessment & Plan:   1. Fever, unspecified fever cause - Veritor Flu A/B Waived  2. Acute non-recurrent frontal sinusitis - amoxicillin-clavulanate (AUGMENTIN) 875-125 MG tablet; Take 1 tablet by mouth 2 (two) times daily.  Dispense: 20 tablet; Refill: 0    Current Outpatient Medications:  .  amoxicillin-clavulanate (AUGMENTIN) 875-125 MG tablet, Take 1 tablet by mouth 2 (two) times daily., Disp: 20 tablet, Rfl: 0 .  aspirin EC 81 MG tablet, Take 81 mg by mouth., Disp: , Rfl:  .  atorvastatin (LIPITOR) 80 MG tablet, Take 1 tablet (80 mg total) by mouth daily., Disp: 90 tablet, Rfl: 3 .  Cholecalciferol (VITAMIN D3) 2000 units capsule, Take by mouth., Disp: , Rfl:  .  lisinopril-hydrochlorothiazide (PRINZIDE,ZESTORETIC) 10-12.5 MG tablet, Take 1 tablet by mouth every morning., Disp: 90 tablet, Rfl: 3 .  metoprolol tartrate (LOPRESSOR) 25 MG tablet, Take 1 tablet (25 mg total) by mouth 2 (two) times daily., Disp: 180 tablet, Rfl: 3 .  Multiple Vitamin (MULTIVITAMIN) capsule, Take by mouth., Disp: , Rfl:  .  nitroGLYCERIN (NITROSTAT) 0.4 MG SL tablet, Place 0.4 mg under the tongue., Disp: , Rfl:  .  Omega-3 1000 MG CAPS, Take 1 g by mouth., Disp: , Rfl:  .  omeprazole (PRILOSEC) 40 MG capsule, Take 1 capsule (40 mg total) by mouth daily., Disp: 90 capsule, Rfl: 3 .  vitamin C (ASCORBIC ACID) 500 MG tablet, Take 500 mg by  mouth., Disp: , Rfl:  Continue all other maintenance medications as listed above.  Follow up plan: Return if symptoms worsen or fail to improve.  Educational handout given for survey  Remus LofflerAngel S. Sravya Grissom PA-C [facility] [address]  [place], [place] [address] [phone]   05/21/2017, 1:06 PM

## 2017-05-31 ENCOUNTER — Other Ambulatory Visit: Payer: Self-pay | Admitting: Physician Assistant

## 2017-05-31 DIAGNOSIS — J0110 Acute frontal sinusitis, unspecified: Secondary | ICD-10-CM

## 2017-06-03 ENCOUNTER — Other Ambulatory Visit: Payer: Self-pay | Admitting: Physician Assistant

## 2017-06-03 DIAGNOSIS — J0110 Acute frontal sinusitis, unspecified: Secondary | ICD-10-CM

## 2017-06-04 NOTE — Telephone Encounter (Signed)
Last seen 05/18/17  Angel 

## 2017-09-17 ENCOUNTER — Other Ambulatory Visit: Payer: Self-pay | Admitting: *Deleted

## 2017-09-17 MED ORDER — NITROGLYCERIN 0.4 MG SL SUBL: 0.4000 mg | SUBLINGUAL_TABLET | SUBLINGUAL | 0 refills | Status: DC | PRN

## 2017-11-05 ENCOUNTER — Other Ambulatory Visit: Payer: Self-pay | Admitting: Family Medicine

## 2018-01-26 ENCOUNTER — Other Ambulatory Visit: Payer: Self-pay | Admitting: Family Medicine

## 2018-01-26 DIAGNOSIS — E7849 Other hyperlipidemia: Secondary | ICD-10-CM

## 2018-02-11 ENCOUNTER — Other Ambulatory Visit: Payer: Self-pay | Admitting: Family Medicine

## 2018-02-11 DIAGNOSIS — E7849 Other hyperlipidemia: Secondary | ICD-10-CM

## 2018-02-12 NOTE — Telephone Encounter (Signed)
Authorize 30 days only. Then contact the patient letting them know that they will need an appointment before any further prescriptions can be sent in. 

## 2018-02-12 NOTE — Telephone Encounter (Signed)
Last lipid 03/22/17

## 2018-02-12 NOTE — Telephone Encounter (Signed)
Patient has appointment 11/1 for CPE

## 2018-03-01 ENCOUNTER — Ambulatory Visit (INDEPENDENT_AMBULATORY_CARE_PROVIDER_SITE_OTHER): Payer: BLUE CROSS/BLUE SHIELD | Admitting: *Deleted

## 2018-03-01 DIAGNOSIS — Z23 Encounter for immunization: Secondary | ICD-10-CM | POA: Diagnosis not present

## 2018-03-22 ENCOUNTER — Encounter: Payer: Self-pay | Admitting: Family Medicine

## 2018-03-22 ENCOUNTER — Ambulatory Visit (INDEPENDENT_AMBULATORY_CARE_PROVIDER_SITE_OTHER): Payer: BLUE CROSS/BLUE SHIELD | Admitting: Family Medicine

## 2018-03-22 VITALS — BP 137/83 | HR 60 | Temp 96.9°F | Ht 70.0 in | Wt 191.8 lb

## 2018-03-22 DIAGNOSIS — Z0000 Encounter for general adult medical examination without abnormal findings: Secondary | ICD-10-CM | POA: Diagnosis not present

## 2018-03-22 DIAGNOSIS — E559 Vitamin D deficiency, unspecified: Secondary | ICD-10-CM

## 2018-03-22 DIAGNOSIS — E7849 Other hyperlipidemia: Secondary | ICD-10-CM

## 2018-03-22 DIAGNOSIS — K219 Gastro-esophageal reflux disease without esophagitis: Secondary | ICD-10-CM

## 2018-03-22 DIAGNOSIS — I10 Essential (primary) hypertension: Secondary | ICD-10-CM

## 2018-03-22 DIAGNOSIS — M7122 Synovial cyst of popliteal space [Baker], left knee: Secondary | ICD-10-CM

## 2018-03-22 LAB — URINALYSIS
Bilirubin, UA: NEGATIVE
GLUCOSE, UA: NEGATIVE
Ketones, UA: NEGATIVE
Leukocytes, UA: NEGATIVE
Nitrite, UA: NEGATIVE
PH UA: 7.5 (ref 5.0–7.5)
PROTEIN UA: NEGATIVE
RBC, UA: NEGATIVE
Specific Gravity, UA: 1.015 (ref 1.005–1.030)
Urobilinogen, Ur: 0.2 mg/dL (ref 0.2–1.0)

## 2018-03-22 MED ORDER — LISINOPRIL-HYDROCHLOROTHIAZIDE 10-12.5 MG PO TABS: 1.0000 | ORAL_TABLET | ORAL | 1 refills | Status: DC

## 2018-03-22 MED ORDER — OMEPRAZOLE 40 MG PO CPDR: 40.0000 mg | DELAYED_RELEASE_CAPSULE | Freq: Every day | ORAL | 1 refills | Status: DC

## 2018-03-22 MED ORDER — METOPROLOL TARTRATE 25 MG PO TABS: 25.0000 mg | ORAL_TABLET | Freq: Two times a day (BID) | ORAL | 1 refills | Status: DC

## 2018-03-22 MED ORDER — ATORVASTATIN CALCIUM 80 MG PO TABS: 80.0000 mg | ORAL_TABLET | Freq: Every day | ORAL | 1 refills | Status: DC

## 2018-03-22 NOTE — Patient Instructions (Signed)
OTC Krill Oil 300 mg twice a day

## 2018-03-22 NOTE — Addendum Note (Signed)
Addended by: Julious Payer D on: 03/22/2018 11:30 AM   Modules accepted: Orders

## 2018-03-22 NOTE — Progress Notes (Signed)
Subjective:  Patient ID: Willie Brown, male    DOB: 1960-11-29  Age: 57 y.o. MRN: [id number]  CC: Annual Exam   HPI Cleave Ternes Schurman presents for Complete physical.  Follow-up of hypertension. Patient has no history of headache chest pain or shortness of breath or recent cough. Patient also denies symptoms of TIA such as numbness weakness lateralizing. Patient checks  blood pressure at home and has not had any elevated readings recently. Patient denies side effects from his medication. States taking it regularly. Patient in for follow-up of elevated cholesterol. Doing well without complaints on current medication. Denies side effects of statin including myalgia and arthralgia and nausea. Also in today for liver function testing. Currently no chest pain, shortness of breath or other cardiovascular related symptoms noted.  Patient in for follow-up of GERD. Currently asymptomatic taking  PPI daily. There is no chest pain or heartburn. No hematemesis and no melena. No dysphagia or choking. Onset is remote. Progression is stable. Complicating factors, none. Patient also mentions that he has had some pain in both legs.  Notices bulging behind the left knee.  Both of these improved when he got some new shoes for work after his mom recommended this.  Depression screen [facility] 2/9 03/22/2018 05/18/2017 03/22/2017  Decreased Interest 0 0 0  Down, Depressed, Hopeless 0 0 0  PHQ - 2 Score 0 0 0    History Duane has a past medical history of GERD (gastroesophageal reflux disease), Hyperlipidemia, and Hypertension.   He has a past surgical history that includes none.   His family history includes Arthritis in his mother.He reports that he has never smoked. He has never used smokeless tobacco. He reports that he does not drink alcohol or use drugs.    ROS Review of Systems  Constitutional: Negative for activity change, fatigue and unexpected weight change.  HENT: Negative for congestion, ear pain,  hearing loss, postnasal drip and trouble swallowing.   Eyes: Negative for pain and visual disturbance.  Respiratory: Negative for cough, chest tightness and shortness of breath.   Cardiovascular: Negative for chest pain, palpitations and leg swelling.  Gastrointestinal: Negative for abdominal distention, abdominal pain, blood in stool, constipation, diarrhea, nausea and vomiting.  Endocrine: Negative for cold intolerance, heat intolerance and polydipsia.  Genitourinary: Negative for difficulty urinating, dysuria, flank pain, frequency and urgency.  Musculoskeletal: Negative for arthralgias and joint swelling.  Skin: Negative for color change, rash and wound.  Neurological: Negative for dizziness, syncope, speech difficulty, weakness, light-headedness, numbness and headaches.  Hematological: Does not bruise/bleed easily.  Psychiatric/Behavioral: Negative for confusion, decreased concentration, dysphoric mood and sleep disturbance. The patient is not nervous/anxious.     Objective:  BP 137/83   Pulse 60   Temp (!) 96.9 F (36.1 C) (Oral)   Ht _0  (1.778 m)   Wt 191 lb 12.8 oz (87 kg)   BMI 27.52 kg/m   BP Readings from Last 3 Encounters:  03/22/18 137/83  05/18/17 139/84  03/22/17 129/82    Wt Readings from Last 3 Encounters:  03/22/18 191 lb 12.8 oz (87 kg)  05/18/17 193 lb 6.4 oz (87.7 kg)  03/22/17 190 lb (86.2 kg)     Physical Exam  Constitutional: He is oriented to person, place, and time. He appears well-developed and well-nourished.  HENT:  Head: Normocephalic and atraumatic.  Mouth/Throat: Oropharynx is clear and moist.  Eyes: Pupils are equal, round, and reactive to light. EOM are normal.  Neck: Normal range of motion.  No tracheal deviation present. No thyromegaly present.  Cardiovascular: Normal rate, regular rhythm and normal heart sounds. Exam reveals no gallop and no friction rub.  No murmur heard. Pulmonary/Chest: Breath sounds normal. He has no wheezes.  He has no rales.  Abdominal: Soft. Bowel sounds are normal. He exhibits no distension and no mass. There is no tenderness. Hernia confirmed negative in the right inguinal area and confirmed negative in the left inguinal area.  Genitourinary: Testes normal and penis normal.  Musculoskeletal: Normal range of motion. He exhibits deformity (Baker's cyst behind left knee). He exhibits no edema.  Lymphadenopathy:    He has no cervical adenopathy.  Neurological: He is alert and oriented to person, place, and time.  Skin: Skin is warm and dry.  Psychiatric: He has a normal mood and affect.      Assessment & Plan:   Jaedin was seen today for annual exam.  Diagnoses and all orders for this visit:  Well adult exam -     CBC with Differential/Platelet -     CMP14+EGFR -     Lipid panel -     PSA, total and free -     Urinalysis  Other hyperlipidemia -     CMP14+EGFR -     Lipid panel  Benign essential HTN -     CMP14+EGFR  Gastroesophageal reflux disease without esophagitis -     CBC with Differential/Platelet  Baker's cyst of knee, left  Vitamin D deficiency -     VITAMIN D 25 Hydroxy (Vit-D Deficiency, Fractures)       I have discontinued Zarin J. Cooks's vitamin C and amoxicillin-clavulanate. I am also having him maintain his aspirin EC, Omega-3, Vitamin D3, multivitamin, lisinopril-hydrochlorothiazide, metoprolol tartrate, omeprazole, nitroGLYCERIN, and atorvastatin.  Allergies as of 03/22/2018      Reactions   Codeine Nausea And Vomiting   Doxycycline Nausea And Vomiting      Medication List        Accurate as of 03/22/18 10:49 AM. Always use your most recent med list.          aspirin EC 81 MG tablet Take 81 mg by mouth.   atorvastatin 80 MG tablet Commonly known as:  LIPITOR TAKE 1 TABLET BY MOUTH  DAILY   lisinopril-hydrochlorothiazide 10-12.5 MG tablet Commonly known as:  PRINZIDE,ZESTORETIC Take 1 tablet by mouth every morning.   metoprolol  tartrate 25 MG tablet Commonly known as:  LOPRESSOR Take 1 tablet (25 mg total) by mouth 2 (two) times daily.   multivitamin capsule Take by mouth.   nitroGLYCERIN 0.4 MG SL tablet Commonly known as:  NITROSTAT DISSOLVE 1 TABLET UNDER THE TONGUE EVERY 5 MINUTES AS  NEEDED FOR CHEST PAIN . MAX 3 TABS IN 15 MINUTES. CALL  911 IF CHEST PAIN PERSISTS   Omega-3 1000 MG Caps Take 1 g by mouth.   omeprazole 40 MG capsule Commonly known as:  PRILOSEC Take 1 capsule (40 mg total) by mouth daily.   Vitamin D3 2000 units capsule Take by mouth.        Follow-up: Return in about 6 months (around 09/20/2018).  Claretta Fraise, M.D.

## 2018-03-23 LAB — CBC WITH DIFFERENTIAL/PLATELET
BASOS ABS: 0 10*3/uL (ref 0.0–0.2)
BASOS: 1 %
EOS (ABSOLUTE): 0.2 10*3/uL (ref 0.0–0.4)
Eos: 3 %
Hematocrit: 43.6 % (ref 37.5–51.0)
Hemoglobin: 15.3 g/dL (ref 13.0–17.7)
IMMATURE GRANS (ABS): 0 10*3/uL (ref 0.0–0.1)
IMMATURE GRANULOCYTES: 0 %
LYMPHS: 28 %
Lymphocytes Absolute: 1.9 10*3/uL (ref 0.7–3.1)
MCH: 31.4 pg (ref 26.6–33.0)
MCHC: 35.1 g/dL (ref 31.5–35.7)
MCV: 89 fL (ref 79–97)
Monocytes Absolute: 0.7 10*3/uL (ref 0.1–0.9)
Monocytes: 11 %
NEUTROS PCT: 57 %
Neutrophils Absolute: 3.7 10*3/uL (ref 1.4–7.0)
PLATELETS: 253 10*3/uL (ref 150–450)
RBC: 4.88 x10E6/uL (ref 4.14–5.80)
RDW: 12.7 % (ref 12.3–15.4)
WBC: 6.5 10*3/uL (ref 3.4–10.8)

## 2018-03-23 LAB — CMP14+EGFR
ALBUMIN: 4.1 g/dL (ref 3.5–5.5)
ALT: 22 IU/L (ref 0–44)
AST: 25 IU/L (ref 0–40)
Albumin/Globulin Ratio: 1.6 (ref 1.2–2.2)
Alkaline Phosphatase: 55 IU/L (ref 39–117)
BUN / CREAT RATIO: 13 (ref 9–20)
BUN: 11 mg/dL (ref 6–24)
Bilirubin Total: 0.8 mg/dL (ref 0.0–1.2)
CALCIUM: 9.5 mg/dL (ref 8.7–10.2)
CO2: 26 mmol/L (ref 20–29)
CREATININE: 0.82 mg/dL (ref 0.76–1.27)
Chloride: 102 mmol/L (ref 96–106)
GFR calc Af Amer: 113 mL/min/{1.73_m2} (ref >59)
GFR, EST NON AFRICAN AMERICAN: 98 mL/min/{1.73_m2} (ref >59)
GLOBULIN, TOTAL: 2.6 g/dL (ref 1.5–4.5)
GLUCOSE: 98 mg/dL (ref 65–99)
Potassium: 4.2 mmol/L (ref 3.5–5.2)
Sodium: 140 mmol/L (ref 134–144)
Total Protein: 6.7 g/dL (ref 6.0–8.5)

## 2018-03-23 LAB — LIPID PANEL
CHOL/HDL RATIO: 2.6 ratio (ref 0.0–5.0)
CHOLESTEROL TOTAL: 102 mg/dL (ref 100–199)
HDL: 39 mg/dL — ABNORMAL LOW (ref >39)
LDL CALC: 49 mg/dL (ref 0–99)
TRIGLYCERIDES: 71 mg/dL (ref 0–149)
VLDL CHOLESTEROL CAL: 14 mg/dL (ref 5–40)

## 2018-03-23 LAB — VITAMIN D 25 HYDROXY (VIT D DEFICIENCY, FRACTURES): Vit D, 25-Hydroxy: 49.6 ng/mL (ref 30.0–100.0)

## 2018-03-23 LAB — PSA, TOTAL AND FREE
PROSTATE SPECIFIC AG, SERUM: 1.4 ng/mL (ref 0.0–4.0)
PSA FREE PCT: 29.3 %
PSA FREE: 0.41 ng/mL

## 2018-03-24 NOTE — Progress Notes (Signed)
Hello Willie Brown,  Your lab result is normal.Some minor variations that are not significant are commonly marked abnormal, but do not represent any medical problem for you.  Best regards, Blu Lori, M.D.

## 2018-03-25 ENCOUNTER — Telehealth: Payer: Self-pay | Admitting: Family Medicine

## 2018-03-25 NOTE — Telephone Encounter (Signed)
Pt aware of labs  

## 2018-05-26 DIAGNOSIS — J0140 Acute pansinusitis, unspecified: Secondary | ICD-10-CM | POA: Diagnosis not present

## 2018-05-26 DIAGNOSIS — I10 Essential (primary) hypertension: Secondary | ICD-10-CM | POA: Diagnosis not present

## 2018-07-18 DIAGNOSIS — H66001 Acute suppurative otitis media without spontaneous rupture of ear drum, right ear: Secondary | ICD-10-CM | POA: Diagnosis not present

## 2018-09-25 ENCOUNTER — Other Ambulatory Visit: Payer: Self-pay | Admitting: Family Medicine

## 2018-09-25 DIAGNOSIS — E7849 Other hyperlipidemia: Secondary | ICD-10-CM

## 2018-09-25 DIAGNOSIS — I10 Essential (primary) hypertension: Secondary | ICD-10-CM

## 2018-09-25 DIAGNOSIS — K219 Gastro-esophageal reflux disease without esophagitis: Secondary | ICD-10-CM

## 2018-10-03 ENCOUNTER — Telehealth: Payer: Self-pay | Admitting: Family Medicine

## 2018-10-08 ENCOUNTER — Ambulatory Visit (INDEPENDENT_AMBULATORY_CARE_PROVIDER_SITE_OTHER): Payer: BLUE CROSS/BLUE SHIELD | Admitting: Family Medicine

## 2018-10-08 ENCOUNTER — Other Ambulatory Visit: Payer: Self-pay

## 2018-10-08 ENCOUNTER — Encounter: Payer: Self-pay | Admitting: Family Medicine

## 2018-10-08 DIAGNOSIS — E7849 Other hyperlipidemia: Secondary | ICD-10-CM | POA: Diagnosis not present

## 2018-10-08 DIAGNOSIS — K219 Gastro-esophageal reflux disease without esophagitis: Secondary | ICD-10-CM | POA: Diagnosis not present

## 2018-10-08 DIAGNOSIS — I10 Essential (primary) hypertension: Secondary | ICD-10-CM | POA: Diagnosis not present

## 2018-10-08 MED ORDER — OMEPRAZOLE 40 MG PO CPDR: 40.0000 mg | DELAYED_RELEASE_CAPSULE | Freq: Every day | ORAL | 1 refills | Status: DC

## 2018-10-08 MED ORDER — METOPROLOL TARTRATE 25 MG PO TABS: 25.0000 mg | ORAL_TABLET | Freq: Two times a day (BID) | ORAL | 1 refills | Status: DC

## 2018-10-08 MED ORDER — LISINOPRIL-HYDROCHLOROTHIAZIDE 10-12.5 MG PO TABS: 1.0000 | ORAL_TABLET | ORAL | 1 refills | Status: DC

## 2018-10-08 MED ORDER — ATORVASTATIN CALCIUM 80 MG PO TABS: 80.0000 mg | ORAL_TABLET | Freq: Every day | ORAL | 1 refills | Status: DC

## 2018-10-08 NOTE — Progress Notes (Signed)
    Subjective:    Patient ID: Willie Brown, male    DOB: [birth year], 58 y.o.   MRN: [id number]   HPI: Willie Brown is a 58 y.o. male presenting for follow-up of hypertension. Patient has no history of headache chest pain or shortness of breath or recent cough. Patient also denies symptoms of TIA such as numbness weakness lateralizing. Patient checks  blood pressure at home and has not had any elevated readings recently. Patient denies side effects from his medication. States taking it regularly. Patient in for follow-up of elevated cholesterol. Doing well without complaints on current medication. Denies side effects of statin including myalgia and arthralgia and nausea. Also in today for liver function testing. Currently no chest pain, shortness of breath or other cardiovascular related symptoms noted.  BP readings 118/70s to 120/82 Avoiding red meat. Focused on fish and chicken. Avoids red meat.    Depression screen [facility] 2/9 03/22/2018 05/18/2017 03/22/2017 05/03/2016 03/07/2016  Decreased Interest 0 0 0 0 0  Down, Depressed, Hopeless 0 0 0 0 0  PHQ - 2 Score 0 0 0 0 0     Relevant past medical, surgical, family and social history reviewed and updated as indicated.  Interim medical history since our last visit reviewed. Allergies and medications reviewed and updated.  ROS:  Review of Systems  Constitutional: Negative.   HENT: Negative.   Eyes: Negative for visual disturbance.  Respiratory: Negative for cough and shortness of breath.   Cardiovascular: Negative for chest pain and leg swelling.  Gastrointestinal: Negative for abdominal pain, diarrhea, nausea and vomiting.  Genitourinary: Negative for difficulty urinating.  Musculoskeletal: Negative for arthralgias and myalgias.  Skin: Negative for rash.  Neurological: Negative for headaches.  Psychiatric/Behavioral: Negative for sleep disturbance.     Social History   Tobacco Use  Smoking Status Never Smoker  Smokeless  Tobacco Never Used       Objective:     Wt Readings from Last 3 Encounters:  03/22/18 191 lb 12.8 oz (87 kg)  05/18/17 193 lb 6.4 oz (87.7 kg)  03/22/17 190 lb (86.2 kg)     Exam deferred. Pt. Harboring due to COVID 19. Phone visit performed.   Assessment & Plan:  No diagnosis found.  No orders of the defined types were placed in this encounter.   No orders of the defined types were placed in this encounter.     There are no diagnoses linked to this encounter.  Virtual Visit via telephone Note  I discussed the limitations, risks, security and privacy concerns of performing an evaluation and management service by telephone and the availability of in person appointments. The patient was identified with two identifiers. Pt.expressed understanding and agreed to proceed. Pt. Is at home. Dr. Darlyn Read is in his office.  Follow Up Instructions:   I discussed the assessment and treatment plan with the patient. The patient was provided an opportunity to ask questions and all were answered. The patient agreed with the plan and demonstrated an understanding of the instructions.   The patient was advised to call back or seek an in-person evaluation if the symptoms worsen or if the condition fails to improve as anticipated.   Total minutes including chart review and phone contact time: 20   Follow up plan: No follow-ups on file.  Mechele Claude, MD [place]

## 2019-02-11 ENCOUNTER — Other Ambulatory Visit: Payer: Self-pay

## 2019-02-11 ENCOUNTER — Ambulatory Visit (INDEPENDENT_AMBULATORY_CARE_PROVIDER_SITE_OTHER): Payer: BC Managed Care – PPO

## 2019-02-11 DIAGNOSIS — Z23 Encounter for immunization: Secondary | ICD-10-CM | POA: Diagnosis not present

## 2019-02-25 ENCOUNTER — Other Ambulatory Visit: Payer: Self-pay | Admitting: Family Medicine

## 2019-02-25 DIAGNOSIS — K219 Gastro-esophageal reflux disease without esophagitis: Secondary | ICD-10-CM

## 2019-02-25 DIAGNOSIS — E7849 Other hyperlipidemia: Secondary | ICD-10-CM

## 2019-02-25 DIAGNOSIS — I10 Essential (primary) hypertension: Secondary | ICD-10-CM

## 2019-03-25 ENCOUNTER — Ambulatory Visit (INDEPENDENT_AMBULATORY_CARE_PROVIDER_SITE_OTHER): Payer: BC Managed Care – PPO | Admitting: Family Medicine

## 2019-03-25 ENCOUNTER — Other Ambulatory Visit: Payer: Self-pay

## 2019-03-25 ENCOUNTER — Encounter: Payer: Self-pay | Admitting: Family Medicine

## 2019-03-25 VITALS — BP 145/89 | HR 65 | Temp 97.0°F | Resp 20 | Ht 71.0 in | Wt 189.0 lb

## 2019-03-25 DIAGNOSIS — E7849 Other hyperlipidemia: Secondary | ICD-10-CM

## 2019-03-25 DIAGNOSIS — J0100 Acute maxillary sinusitis, unspecified: Secondary | ICD-10-CM

## 2019-03-25 DIAGNOSIS — Z0001 Encounter for general adult medical examination with abnormal findings: Secondary | ICD-10-CM | POA: Diagnosis not present

## 2019-03-25 DIAGNOSIS — E559 Vitamin D deficiency, unspecified: Secondary | ICD-10-CM | POA: Diagnosis not present

## 2019-03-25 DIAGNOSIS — Z125 Encounter for screening for malignant neoplasm of prostate: Secondary | ICD-10-CM | POA: Diagnosis not present

## 2019-03-25 DIAGNOSIS — I10 Essential (primary) hypertension: Secondary | ICD-10-CM | POA: Diagnosis not present

## 2019-03-25 DIAGNOSIS — Z0000 Encounter for general adult medical examination without abnormal findings: Secondary | ICD-10-CM

## 2019-03-25 LAB — URINALYSIS
Bilirubin, UA: NEGATIVE
Glucose, UA: NEGATIVE
Ketones, UA: NEGATIVE
Leukocytes,UA: NEGATIVE
Nitrite, UA: NEGATIVE
Protein,UA: NEGATIVE
RBC, UA: NEGATIVE
Specific Gravity, UA: 1.02 (ref 1.005–1.030)
Urobilinogen, Ur: 0.2 mg/dL (ref 0.2–1.0)
pH, UA: 8.5 — ABNORMAL HIGH (ref 5.0–7.5)

## 2019-03-25 MED ORDER — AMOXICILLIN-POT CLAVULANATE 875-125 MG PO TABS: 1.0000 | ORAL_TABLET | Freq: Two times a day (BID) | ORAL | 0 refills | Status: DC

## 2019-03-25 NOTE — Addendum Note (Signed)
Addended by: Claretta Fraise on: 03/25/2019 10:02 AM   Modules accepted: Orders

## 2019-03-25 NOTE — Addendum Note (Signed)
Addended by: Rolena Infante on: 03/25/2019 10:06 AM   Modules accepted: Orders

## 2019-03-25 NOTE — Progress Notes (Addendum)
Subjective:  Patient ID: Willie Brown, male    DOB: 1961-04-24  Age: 58 y.o. MRN: [id number]  CC: Annual Exam (CPE ), Hyperlipidemia, and Hypertension   HPI Willie Brown presents for CPE. MI 2014  presents for  follow-up of hypertension. Patient has no history of headache chest pain or shortness of breath or recent cough. Patient also denies symptoms of TIA such as focal numbness or weakness. Patient denies side effects from medication. States taking it regularly. Had MI with stent in 2014. In addition to meds, he walks regularly for exercise to prevent recurrence. His home BP is running 120's/70's. Not sure why it is high here.  in for follow-up  of elevated cholesterol. Doing well without complaints on current medication. Denies side effects of statin including myalgia and arthralgia and nausea. Currently no chest pain, shortness of breath or other cardiovascular related symptoms noted. Patient in for follow-up of GERD. Currently asymptomatic taking  PPI daily. There is no chest pain or heartburn. No hematemesis and no melena. No dysphagia or choking. Onset is remote. Progression is stable. Complicating factors, none. Also sinus infection with cheek pressure, congesting, drainage X 1 week, increasing   Depression screen [facility] 2/9 03/25/2019 03/22/2018 05/18/2017  Decreased Interest 0 0 0  Down, Depressed, Hopeless 0 0 0  PHQ - 2 Score 0 0 0    History Willie Brown has a past medical history of GERD (gastroesophageal reflux disease), Hyperlipidemia, and Hypertension.   He has a past surgical history that includes none.   His family history includes Arthritis in his mother.He reports that he has never smoked. He has never used smokeless tobacco. He reports that he does not drink alcohol or use drugs.    ROS Review of Systems  Constitutional: Negative for activity change, fatigue and unexpected weight change.  HENT: Positive for congestion and postnasal drip. Negative for ear pain,  hearing loss, rhinorrhea and trouble swallowing.   Eyes: Negative for pain and visual disturbance.  Respiratory: Negative for cough, chest tightness and shortness of breath.   Cardiovascular: Negative for chest pain, palpitations and leg swelling.  Gastrointestinal: Negative for abdominal distention, abdominal pain, blood in stool, constipation, diarrhea, nausea and vomiting.  Endocrine: Negative for cold intolerance, heat intolerance and polydipsia.  Genitourinary: Negative for difficulty urinating, dysuria, flank pain, frequency and urgency.  Musculoskeletal: Negative for arthralgias and joint swelling.  Skin: Negative for color change, rash and wound.  Neurological: Positive for dizziness. Negative for syncope, speech difficulty, weakness, light-headedness, numbness and headaches.  Hematological: Does not bruise/bleed easily.  Psychiatric/Behavioral: Negative for confusion, decreased concentration, dysphoric mood and sleep disturbance. The patient is not nervous/anxious.     Objective:  BP (!) 145/89 (BP Location: Right Arm, Cuff Size: Large)   Pulse 65   Temp (!) 97 F (36.1 C)   Resp 20   Ht _0  (1.803 m)   Wt 189 lb (85.7 kg)   SpO2 95%   BMI 26.36 kg/m   BP Readings from Last 3 Encounters:  03/25/19 (!) 145/89  03/22/18 137/83  05/18/17 139/84    Wt Readings from Last 3 Encounters:  03/25/19 189 lb (85.7 kg)  03/22/18 191 lb 12.8 oz (87 kg)  05/18/17 193 lb 6.4 oz (87.7 kg)     Physical Exam Constitutional:      Appearance: He is well-developed.  HENT:     Head: Normocephalic and atraumatic.  Eyes:     Pupils: Pupils are equal, round, and reactive to  light.  Neck:     Musculoskeletal: Normal range of motion.     Thyroid: No thyromegaly.     Trachea: No tracheal deviation.  Cardiovascular:     Rate and Rhythm: Normal rate and regular rhythm.     Heart sounds: Normal heart sounds. No murmur. No friction rub. No gallop.   Pulmonary:     Breath sounds:  Normal breath sounds. No wheezing or rales.  Abdominal:     General: Bowel sounds are normal. There is no distension.     Palpations: Abdomen is soft. There is no mass.     Tenderness: There is no abdominal tenderness.     Hernia: There is no hernia in the left inguinal area.  Genitourinary:    Penis: Normal.      Scrotum/Testes: Normal.  Musculoskeletal: Normal range of motion.  Lymphadenopathy:     Cervical: No cervical adenopathy.  Skin:    General: Skin is warm and dry.  Neurological:     Mental Status: He is alert and oriented to person, place, and time.       Assessment & Plan:   Willie Brown was seen today for annual exam, hyperlipidemia and hypertension.  Diagnoses and all orders for this visit:  Screening for prostate cancer -     PSA Total (Reflex To Free)  Vitamin D deficiency -     CBC with Differential/Platelet -     CMP14+EGFR -     VITAMIN D 25 Hydroxy (Vit-D Deficiency, Fractures)  Benign essential HTN -     CBC with Differential/Platelet -     CMP14+EGFR -     Urinalysis  Other hyperlipidemia -     CBC with Differential/Platelet -     CMP14+EGFR -     Lipid panel  Well adult exam  Acute maxillary sinusitis, recurrence not specified  Other orders -     amoxicillin-clavulanate (AUGMENTIN) 875-125 MG tablet; Take 1 tablet by mouth 2 (two) times daily. Take all of this medication       I am having Willie Brown start on amoxicillin-clavulanate. I am also having him maintain his aspirin EC, Omega-3, Vitamin D3, multivitamin, nitroGLYCERIN, lisinopril-hydrochlorothiazide, atorvastatin, omeprazole, and metoprolol tartrate.  Allergies as of 03/25/2019      Reactions   Codeine Nausea And Vomiting   Doxycycline Nausea And Vomiting      Medication List       Accurate as of March 25, 2019  9:34 AM. If you have any questions, ask your nurse or doctor.        amoxicillin-clavulanate 875-125 MG tablet Commonly known as: AUGMENTIN Take 1  tablet by mouth 2 (two) times daily. Take all of this medication Started by: Claretta Fraise, MD   aspirin EC 81 MG tablet Take 81 mg by mouth.   atorvastatin 80 MG tablet Commonly known as: LIPITOR TAKE 1 TABLET BY MOUTH  DAILY   lisinopril-hydrochlorothiazide 10-12.5 MG tablet Commonly known as: ZESTORETIC Take 1 tablet by mouth every morning.   metoprolol tartrate 25 MG tablet Commonly known as: LOPRESSOR TAKE 1 TABLET BY MOUTH  TWICE DAILY   multivitamin capsule Take by mouth.   nitroGLYCERIN 0.4 MG SL tablet Commonly known as: NITROSTAT DISSOLVE 1 TABLET UNDER THE TONGUE EVERY 5 MINUTES AS  NEEDED FOR CHEST PAIN . MAX 3 TABS IN 15 MINUTES. CALL  911 IF CHEST PAIN PERSISTS   Omega-3 1000 MG Caps Take 1 g by mouth.   omeprazole 40 MG capsule Commonly known  as: PRILOSEC TAKE 1 CAPSULE BY MOUTH  DAILY   Vitamin D3 50 MCG (2000 UT) capsule Take by mouth.      Pt. Will check BP at home X3 each morning and evning, at rest, for 3 days. Record and bring back to office. I will review and recommend if increase BP med is needed.   Follow-up: Return in about 6 months (around 09/22/2019), or if symptoms worsen or fail to improve.  Claretta Fraise, M.D.

## 2019-03-26 ENCOUNTER — Other Ambulatory Visit: Payer: Self-pay | Admitting: Family Medicine

## 2019-03-26 DIAGNOSIS — I10 Essential (primary) hypertension: Secondary | ICD-10-CM

## 2019-03-26 LAB — CBC WITH DIFFERENTIAL/PLATELET
Basophils Absolute: 0 10*3/uL (ref 0.0–0.2)
Basos: 1 %
EOS (ABSOLUTE): 0.2 10*3/uL (ref 0.0–0.4)
Eos: 3 %
Hematocrit: 47 % (ref 37.5–51.0)
Hemoglobin: 16.3 g/dL (ref 13.0–17.7)
Immature Grans (Abs): 0 10*3/uL (ref 0.0–0.1)
Immature Granulocytes: 0 %
Lymphocytes Absolute: 1.7 10*3/uL (ref 0.7–3.1)
Lymphs: 27 %
MCH: 31.2 pg (ref 26.6–33.0)
MCHC: 34.7 g/dL (ref 31.5–35.7)
MCV: 90 fL (ref 79–97)
Monocytes Absolute: 0.6 10*3/uL (ref 0.1–0.9)
Monocytes: 10 %
Neutrophils Absolute: 3.7 10*3/uL (ref 1.4–7.0)
Neutrophils: 59 %
Platelets: 232 10*3/uL (ref 150–450)
RBC: 5.22 x10E6/uL (ref 4.14–5.80)
RDW: 12.8 % (ref 11.6–15.4)
WBC: 6.2 10*3/uL (ref 3.4–10.8)

## 2019-03-26 LAB — CMP14+EGFR
ALT: 23 IU/L (ref 0–44)
AST: 26 IU/L (ref 0–40)
Albumin/Globulin Ratio: 1.7 (ref 1.2–2.2)
Albumin: 4.6 g/dL (ref 3.8–4.9)
Alkaline Phosphatase: 59 IU/L (ref 39–117)
BUN/Creatinine Ratio: 17 (ref 9–20)
BUN: 13 mg/dL (ref 6–24)
Bilirubin Total: 0.9 mg/dL (ref 0.0–1.2)
CO2: 25 mmol/L (ref 20–29)
Calcium: 9.8 mg/dL (ref 8.7–10.2)
Chloride: 100 mmol/L (ref 96–106)
Creatinine, Ser: 0.78 mg/dL (ref 0.76–1.27)
GFR calc Af Amer: 115 mL/min/{1.73_m2} (ref >59)
GFR calc non Af Amer: 99 mL/min/{1.73_m2} (ref >59)
Globulin, Total: 2.7 g/dL (ref 1.5–4.5)
Glucose: 87 mg/dL (ref 65–99)
Potassium: 4.5 mmol/L (ref 3.5–5.2)
Sodium: 140 mmol/L (ref 134–144)
Total Protein: 7.3 g/dL (ref 6.0–8.5)

## 2019-03-26 LAB — LIPID PANEL
Chol/HDL Ratio: 2.8 ratio (ref 0.0–5.0)
Cholesterol, Total: 128 mg/dL (ref 100–199)
HDL: 45 mg/dL (ref >39)
LDL Chol Calc (NIH): 71 mg/dL (ref 0–99)
Triglycerides: 57 mg/dL (ref 0–149)
VLDL Cholesterol Cal: 12 mg/dL (ref 5–40)

## 2019-03-26 LAB — PSA TOTAL (REFLEX TO FREE): Prostate Specific Ag, Serum: 1 ng/mL (ref 0.0–4.0)

## 2019-03-26 LAB — VITAMIN D 25 HYDROXY (VIT D DEFICIENCY, FRACTURES): Vit D, 25-Hydroxy: 53.4 ng/mL (ref 30.0–100.0)

## 2019-03-26 NOTE — Progress Notes (Signed)
Hello Willie Brown,  Your lab result is normal and/or stable.Some minor variations that are not significant are commonly marked abnormal, but do not represent any medical problem for you.  Best regards, Sha Amer, M.D.

## 2019-04-02 ENCOUNTER — Telehealth: Payer: Self-pay | Admitting: Family Medicine

## 2019-04-02 NOTE — Telephone Encounter (Signed)
Please contact the patient Increase lisinopril HCTto 2 tablets a day.

## 2019-04-03 NOTE — Telephone Encounter (Signed)
lmtcb

## 2019-04-08 NOTE — Telephone Encounter (Signed)
Left message to please call our office. 

## 2019-04-08 NOTE — Telephone Encounter (Signed)
Aware of provider's medication change.  Patient will call back with updates on blood pressure.

## 2019-05-17 ENCOUNTER — Other Ambulatory Visit: Payer: Self-pay | Admitting: Family Medicine

## 2019-05-17 DIAGNOSIS — I10 Essential (primary) hypertension: Secondary | ICD-10-CM

## 2019-05-17 DIAGNOSIS — K219 Gastro-esophageal reflux disease without esophagitis: Secondary | ICD-10-CM

## 2019-05-17 DIAGNOSIS — E7849 Other hyperlipidemia: Secondary | ICD-10-CM

## 2019-06-16 ENCOUNTER — Other Ambulatory Visit: Payer: Self-pay | Admitting: Family Medicine

## 2019-06-16 DIAGNOSIS — I10 Essential (primary) hypertension: Secondary | ICD-10-CM

## 2019-06-16 MED ORDER — LISINOPRIL-HYDROCHLOROTHIAZIDE 10-12.5 MG PO TABS: 2.0000 | ORAL_TABLET | Freq: Every morning | ORAL | 0 refills | Status: DC

## 2019-06-16 NOTE — Telephone Encounter (Signed)
What is the name of the medication? Lisinopril 12.5 Stacks up his medication to two a day and he is running out. Wants 3 month supply for #180  Have you contacted your pharmacy to request a refill? YES  Which pharmacy would you like this sent to? [facility]   Patient notified that their request is being sent to the clinical staff for review and that they should receive a call once it is complete. If they do not receive a call within 24 hours they can check with their pharmacy or our office.

## 2019-06-16 NOTE — Telephone Encounter (Signed)
Changed sig per telephone note on 04/02/19, Rx sent to [facility]. LMOVM Rx sent

## 2019-08-06 ENCOUNTER — Other Ambulatory Visit: Payer: Self-pay | Admitting: Family Medicine

## 2019-08-06 DIAGNOSIS — I10 Essential (primary) hypertension: Secondary | ICD-10-CM

## 2019-08-18 ENCOUNTER — Encounter: Payer: Self-pay | Admitting: *Deleted

## 2019-09-23 ENCOUNTER — Ambulatory Visit: Payer: BC Managed Care – PPO | Admitting: Family Medicine

## 2019-10-26 ENCOUNTER — Other Ambulatory Visit: Payer: Self-pay | Admitting: Family Medicine

## 2019-10-26 DIAGNOSIS — I10 Essential (primary) hypertension: Secondary | ICD-10-CM

## 2019-10-27 NOTE — Telephone Encounter (Signed)
Stacks. NTBS 6 mos was to be May appt mail order not sent

## 2019-10-27 NOTE — Telephone Encounter (Signed)
Detailed message left for patient that he needs to be seen to please call to schedule appointment. Once appointment scheduled we can give enough medication to last until appointment.

## 2019-11-11 ENCOUNTER — Other Ambulatory Visit: Payer: Self-pay | Admitting: Family Medicine

## 2019-11-11 DIAGNOSIS — I10 Essential (primary) hypertension: Secondary | ICD-10-CM

## 2019-11-12 NOTE — Telephone Encounter (Signed)
Stacks. NTBS 6 mos ckup was to be in May. Mail order not sent

## 2019-11-12 NOTE — Telephone Encounter (Signed)
Left detailed message to make appointment. °

## 2020-01-27 ENCOUNTER — Telehealth: Payer: Self-pay | Admitting: Family Medicine

## 2020-01-27 NOTE — Telephone Encounter (Signed)
Patient wife states he will not come in advised of things to watch for patient will need to be seen

## 2020-01-27 NOTE — Telephone Encounter (Signed)
Pts wife states spouse is bleeding from rectum when he goes to the restroom. Pt is not wanting to come in for an appt. She is requesting to speak with the nurse.

## 2020-03-12 ENCOUNTER — Encounter: Payer: Self-pay | Admitting: *Deleted

## 2020-03-22 ENCOUNTER — Telehealth: Payer: Self-pay

## 2020-03-22 NOTE — Telephone Encounter (Signed)
Pt called stating that he got a call from our office needing to r/s his appt with Dr Darlyn Read because he wasn't going to be in the office the day scheduled. Pt had to r/s for 04/13/20 but says he will run out of his BP medicine before then. Can we send in a 90 day supply to [facility] so that pt doesn't run out of medicine before his appt?

## 2020-03-22 NOTE — Telephone Encounter (Signed)
Left message to call back  

## 2020-03-25 ENCOUNTER — Other Ambulatory Visit: Payer: Self-pay | Admitting: *Deleted

## 2020-03-25 ENCOUNTER — Telehealth: Payer: Self-pay

## 2020-03-25 DIAGNOSIS — I10 Essential (primary) hypertension: Secondary | ICD-10-CM

## 2020-03-25 MED ORDER — LISINOPRIL-HYDROCHLOROTHIAZIDE 10-12.5 MG PO TABS: ORAL_TABLET | ORAL | 0 refills | Status: DC

## 2020-03-25 NOTE — Telephone Encounter (Signed)
Pt had a scheduled apt on 03/30/2020 but was rescheduled because provider will not be in the office. He was rescheduled for 04/13/2020. He will be able to come but will be out of his bp med before then. He needs a 3 mo supply for lisinopril-hydrochlorothiazide (ZESTORETIC) 10-12.5 MG tablet to [facility] order. Please call back when done.

## 2020-03-25 NOTE — Telephone Encounter (Signed)
REFILL SENT AS REQUESTED ?

## 2020-03-30 ENCOUNTER — Encounter: Payer: BC Managed Care – PPO | Admitting: Family Medicine

## 2020-03-30 ENCOUNTER — Ambulatory Visit: Payer: BC Managed Care – PPO

## 2020-03-30 DIAGNOSIS — M79671 Pain in right foot: Secondary | ICD-10-CM | POA: Diagnosis not present

## 2020-03-30 DIAGNOSIS — B070 Plantar wart: Secondary | ICD-10-CM | POA: Diagnosis not present

## 2020-04-02 ENCOUNTER — Other Ambulatory Visit: Payer: Self-pay | Admitting: Family Medicine

## 2020-04-02 DIAGNOSIS — K219 Gastro-esophageal reflux disease without esophagitis: Secondary | ICD-10-CM

## 2020-04-02 DIAGNOSIS — E7849 Other hyperlipidemia: Secondary | ICD-10-CM

## 2020-04-02 DIAGNOSIS — I10 Essential (primary) hypertension: Secondary | ICD-10-CM

## 2020-04-13 ENCOUNTER — Encounter: Payer: Self-pay | Admitting: Family Medicine

## 2020-04-13 ENCOUNTER — Ambulatory Visit (INDEPENDENT_AMBULATORY_CARE_PROVIDER_SITE_OTHER): Payer: BC Managed Care – PPO | Admitting: Family Medicine

## 2020-04-13 ENCOUNTER — Other Ambulatory Visit: Payer: Self-pay

## 2020-04-13 VITALS — BP 182/105 | HR 60 | Temp 97.3°F | Resp 20 | Ht 71.0 in | Wt 187.0 lb

## 2020-04-13 DIAGNOSIS — Z23 Encounter for immunization: Secondary | ICD-10-CM | POA: Diagnosis not present

## 2020-04-13 DIAGNOSIS — K219 Gastro-esophageal reflux disease without esophagitis: Secondary | ICD-10-CM

## 2020-04-13 DIAGNOSIS — Z125 Encounter for screening for malignant neoplasm of prostate: Secondary | ICD-10-CM

## 2020-04-13 DIAGNOSIS — E7849 Other hyperlipidemia: Secondary | ICD-10-CM | POA: Diagnosis not present

## 2020-04-13 DIAGNOSIS — I10 Essential (primary) hypertension: Secondary | ICD-10-CM | POA: Diagnosis not present

## 2020-04-13 DIAGNOSIS — Z0001 Encounter for general adult medical examination with abnormal findings: Secondary | ICD-10-CM

## 2020-04-13 DIAGNOSIS — Z114 Encounter for screening for human immunodeficiency virus [HIV]: Secondary | ICD-10-CM

## 2020-04-13 DIAGNOSIS — Z0000 Encounter for general adult medical examination without abnormal findings: Secondary | ICD-10-CM | POA: Diagnosis not present

## 2020-04-13 DIAGNOSIS — Z1159 Encounter for screening for other viral diseases: Secondary | ICD-10-CM

## 2020-04-13 MED ORDER — ATORVASTATIN CALCIUM 80 MG PO TABS: 80.0000 mg | ORAL_TABLET | Freq: Every day | ORAL | 1 refills | Status: DC

## 2020-04-13 MED ORDER — LISINOPRIL-HYDROCHLOROTHIAZIDE 10-12.5 MG PO TABS: 1.0000 | ORAL_TABLET | Freq: Every day | ORAL | 1 refills | Status: DC

## 2020-04-13 MED ORDER — METOPROLOL TARTRATE 50 MG PO TABS: 50.0000 mg | ORAL_TABLET | Freq: Two times a day (BID) | ORAL | 1 refills | Status: DC

## 2020-04-13 MED ORDER — NITROGLYCERIN 0.4 MG SL SUBL
SUBLINGUAL_TABLET | SUBLINGUAL | 1 refills | Status: DC
Start: 2020-04-13 — End: 2020-06-14

## 2020-04-13 MED ORDER — OMEPRAZOLE 40 MG PO CPDR: 40.0000 mg | DELAYED_RELEASE_CAPSULE | Freq: Every day | ORAL | 1 refills | Status: DC

## 2020-04-13 NOTE — Progress Notes (Signed)
Subjective:  Patient ID: Willie Brown, male    DOB: 1961-02-09  Age: 59 y.o. MRN: [id number]  CC: Annual Exam   HPI Willie Brown presents for annual physical examination.  He is doing well overall.  He has plans to be with family on Thanksgiving but he does have to work until 3 PM that day.  He is concerned that his blood pressure is high.  Could not tolerate the lisinopril it to a day.  Because of drowsiness.  He notes that he had a plantars wart removed a couple of weeks ago.  It is healed up pretty well now.  He was unable to do much walking as a result of the plantars wart but now he plans on getting back to a regular exercise regimen with walking.   Follow-up of hypertension. Patient has no history of headache chest pain or shortness of breath or recent cough. Patient also denies symptoms of TIA such as numbness weakness lateralizing.   Depression screen [facility] 2/9 04/13/2020 03/25/2019 03/22/2018  Decreased Interest 0 0 0  Down, Depressed, Hopeless 0 0 0  PHQ - 2 Score 0 0 0    History Willie Brown has a past medical history of GERD (gastroesophageal reflux disease), Hyperlipidemia, and Hypertension.   He has a past surgical history that includes none.   His family history includes Arthritis in his mother.He reports that he has never smoked. He has never used smokeless tobacco. He reports that he does not drink alcohol and does not use drugs.    ROS Review of Systems  Constitutional: Negative for activity change, fatigue and unexpected weight change.  HENT: Negative for congestion, ear pain, hearing loss, postnasal drip and trouble swallowing.   Eyes: Negative for pain and visual disturbance.  Respiratory: Negative for cough, chest tightness and shortness of breath.   Cardiovascular: Negative for chest pain, palpitations and leg swelling.  Gastrointestinal: Negative for abdominal distention, abdominal pain, blood in stool, constipation, diarrhea, nausea and vomiting.    Endocrine: Negative for cold intolerance, heat intolerance and polydipsia.  Genitourinary: Negative for difficulty urinating, dysuria, flank pain, frequency and urgency.  Musculoskeletal: Negative for arthralgias and joint swelling.  Skin: Negative for color change, rash and wound.  Neurological: Negative for dizziness, syncope, speech difficulty, weakness, light-headedness, numbness and headaches.  Hematological: Does not bruise/bleed easily.  Psychiatric/Behavioral: Negative for confusion, decreased concentration, dysphoric mood and sleep disturbance. The patient is not nervous/anxious.     Objective:  BP (!) 182/105   Pulse 60   Temp (!) 97.3 F (36.3 C) (Temporal)   Resp 20   Ht 5' 11" (1.803 m)   Wt 187 lb (84.8 kg)   SpO2 98%   BMI 26.08 kg/m   BP Readings from Last 3 Encounters:  04/13/20 (!) 182/105  03/25/19 (!) 145/89  03/22/18 137/83    Wt Readings from Last 3 Encounters:  04/13/20 187 lb (84.8 kg)  03/25/19 189 lb (85.7 kg)  03/22/18 191 lb 12.8 oz (87 kg)     Physical Exam Constitutional:      Appearance: He is well-developed.  HENT:     Head: Normocephalic and atraumatic.  Eyes:     Pupils: Pupils are equal, round, and reactive to light.  Neck:     Thyroid: No thyromegaly.     Trachea: No tracheal deviation.  Cardiovascular:     Rate and Rhythm: Normal rate and regular rhythm.     Heart sounds: Normal heart sounds. No murmur heard.  No friction rub. No gallop.   Pulmonary:     Breath sounds: Normal breath sounds. No wheezing or rales.  Abdominal:     General: Bowel sounds are normal. There is no distension.     Palpations: Abdomen is soft. There is no mass.     Tenderness: There is no abdominal tenderness.     Hernia: There is no hernia in the left inguinal area.  Genitourinary:    Penis: Normal.      Testes: Normal.  Musculoskeletal:        General: Normal range of motion.     Cervical back: Normal range of motion.  Lymphadenopathy:      Cervical: No cervical adenopathy.  Skin:    General: Skin is warm and dry.  Neurological:     Mental Status: He is alert and oriented to person, place, and time.       Assessment & Plan:   Willie Brown was seen today for annual exam.  Diagnoses and all orders for this visit:  Well adult exam -     CBC with Differential/Platelet -     CMP14+EGFR -     Lipid panel -     PSA, total and free -     VITAMIN D 25 Hydroxy (Vit-D Deficiency, Fractures) -     Urinalysis  Need for immunization against influenza -     Flu Vaccine QUAD 36+ mos IM  Essential hypertension -     CBC with Differential/Platelet -     CMP14+EGFR -     Lipid panel -     PSA, total and free -     VITAMIN D 25 Hydroxy (Vit-D Deficiency, Fractures) -     Urinalysis  Benign essential HTN -     metoprolol tartrate (LOPRESSOR) 50 MG tablet; Take 1 tablet (50 mg total) by mouth 2 (two) times daily. -     lisinopril-hydrochlorothiazide (ZESTORETIC) 10-12.5 MG tablet; Take 1 tablet by mouth daily.  Other hyperlipidemia -     atorvastatin (LIPITOR) 80 MG tablet; Take 1 tablet (80 mg total) by mouth daily.  Gastroesophageal reflux disease without esophagitis -     omeprazole (PRILOSEC) 40 MG capsule; Take 1 capsule (40 mg total) by mouth daily.  Screening for prostate cancer  Encounter for screening for HIV -     HIV Antibody (routine testing w rflx)  Need for hepatitis C screening test -     Hepatitis C antibody  Other orders -     nitroGLYCERIN (NITROSTAT) 0.4 MG SL tablet; DISSOLVE 1 TABLET UNDER THE TONGUE EVERY 5 MINUTES AS  NEEDED FOR CHEST PAIN . MAX 3 TABS IN 15 MINUTES. CALL  911 IF CHEST PAIN PERSISTS       I have discontinued Willie Brown's amoxicillin-clavulanate. I have also changed his metoprolol tartrate, atorvastatin, lisinopril-hydrochlorothiazide, and omeprazole. Additionally, I am having him maintain his aspirin EC, Omega-3, Vitamin D3, multivitamin, and nitroGLYCERIN.  Allergies  as of 04/13/2020      Reactions   Codeine Nausea And Vomiting   Doxycycline Nausea And Vomiting      Medication List       Accurate as of April 13, 2020 12:02 PM. If you have any questions, ask your nurse or doctor.        STOP taking these medications   amoxicillin-clavulanate 875-125 MG tablet Commonly known as: AUGMENTIN Stopped by: Claretta Fraise, MD     TAKE these medications   aspirin EC 81 MG tablet  Take 81 mg by mouth.   atorvastatin 80 MG tablet Commonly known as: LIPITOR Take 1 tablet (80 mg total) by mouth daily.   lisinopril-hydrochlorothiazide 10-12.5 MG tablet Commonly known as: ZESTORETIC Take 1 tablet by mouth daily. What changed:   how much to take  how to take this  when to take this  additional instructions Changed by: Claretta Fraise, MD   metoprolol tartrate 50 MG tablet Commonly known as: LOPRESSOR Take 1 tablet (50 mg total) by mouth 2 (two) times daily. What changed:   medication strength  how much to take Changed by: Claretta Fraise, MD   multivitamin capsule Take by mouth.   nitroGLYCERIN 0.4 MG SL tablet Commonly known as: NITROSTAT DISSOLVE 1 TABLET UNDER THE TONGUE EVERY 5 MINUTES AS  NEEDED FOR CHEST PAIN . MAX 3 TABS IN 15 MINUTES. CALL  911 IF CHEST PAIN PERSISTS   Omega-3 1000 MG Caps Take 1 g by mouth.   omeprazole 40 MG capsule Commonly known as: PRILOSEC Take 1 capsule (40 mg total) by mouth daily.   Vitamin D3 50 MCG (2000 UT) capsule Take by mouth.        Follow-up: Return in about 1 month (around 05/13/2020).  Claretta Fraise, M.D.

## 2020-04-14 LAB — CMP14+EGFR
ALT: 27 IU/L (ref 0–44)
AST: 30 IU/L (ref 0–40)
Albumin/Globulin Ratio: 1.6 (ref 1.2–2.2)
Albumin: 4.5 g/dL (ref 3.8–4.9)
Alkaline Phosphatase: 59 IU/L (ref 44–121)
BUN/Creatinine Ratio: 15 (ref 9–20)
BUN: 12 mg/dL (ref 6–24)
Bilirubin Total: 1.2 mg/dL (ref 0.0–1.2)
CO2: 27 mmol/L (ref 20–29)
Calcium: 9.6 mg/dL (ref 8.7–10.2)
Chloride: 101 mmol/L (ref 96–106)
Creatinine, Ser: 0.8 mg/dL (ref 0.76–1.27)
GFR calc Af Amer: 113 mL/min/{1.73_m2} (ref >59)
GFR calc non Af Amer: 98 mL/min/{1.73_m2} (ref >59)
Globulin, Total: 2.9 g/dL (ref 1.5–4.5)
Glucose: 92 mg/dL (ref 65–99)
Potassium: 4.2 mmol/L (ref 3.5–5.2)
Sodium: 139 mmol/L (ref 134–144)
Total Protein: 7.4 g/dL (ref 6.0–8.5)

## 2020-04-14 LAB — CBC WITH DIFFERENTIAL/PLATELET
Basophils Absolute: 0 10*3/uL (ref 0.0–0.2)
Basos: 1 %
EOS (ABSOLUTE): 0.1 10*3/uL (ref 0.0–0.4)
Eos: 2 %
Hematocrit: 48 % (ref 37.5–51.0)
Hemoglobin: 17.2 g/dL (ref 13.0–17.7)
Immature Grans (Abs): 0 10*3/uL (ref 0.0–0.1)
Immature Granulocytes: 0 %
Lymphocytes Absolute: 3.1 10*3/uL (ref 0.7–3.1)
Lymphs: 49 %
MCH: 31.3 pg (ref 26.6–33.0)
MCHC: 35.8 g/dL — ABNORMAL HIGH (ref 31.5–35.7)
MCV: 87 fL (ref 79–97)
Monocytes Absolute: 0.7 10*3/uL (ref 0.1–0.9)
Monocytes: 10 %
Neutrophils Absolute: 2.4 10*3/uL (ref 1.4–7.0)
Neutrophils: 38 %
Platelets: 238 10*3/uL (ref 150–450)
RBC: 5.5 x10E6/uL (ref 4.14–5.80)
RDW: 13.4 % (ref 11.6–15.4)
WBC: 6.3 10*3/uL (ref 3.4–10.8)

## 2020-04-14 LAB — PSA, TOTAL AND FREE
PSA, Free Pct: 41.3 %
PSA, Free: 0.33 ng/mL
Prostate Specific Ag, Serum: 0.8 ng/mL (ref 0.0–4.0)

## 2020-04-14 LAB — LIPID PANEL
Chol/HDL Ratio: 2.6 ratio (ref 0.0–5.0)
Cholesterol, Total: 120 mg/dL (ref 100–199)
HDL: 47 mg/dL (ref >39)
LDL Chol Calc (NIH): 61 mg/dL (ref 0–99)
Triglycerides: 50 mg/dL (ref 0–149)
VLDL Cholesterol Cal: 12 mg/dL (ref 5–40)

## 2020-04-14 LAB — VITAMIN D 25 HYDROXY (VIT D DEFICIENCY, FRACTURES): Vit D, 25-Hydroxy: 65.4 ng/mL (ref 30.0–100.0)

## 2020-04-15 LAB — HEPATITIS C ANTIBODY: Hep C Virus Ab: <0.1 s/co ratio (ref 0.0–0.9)

## 2020-04-15 LAB — SPECIMEN STATUS REPORT

## 2020-04-18 NOTE — Progress Notes (Signed)
Hello Willie Brown,  Your lab result is normal and/or stable.Some minor variations that are not significant are commonly marked abnormal, but do not represent any medical problem for you.  Best regards, Erroll Wilbourne, M.D.

## 2020-04-20 ENCOUNTER — Telehealth: Payer: Self-pay

## 2020-04-20 DIAGNOSIS — D2372 Other benign neoplasm of skin of left lower limb, including hip: Secondary | ICD-10-CM | POA: Diagnosis not present

## 2020-04-20 DIAGNOSIS — M79671 Pain in right foot: Secondary | ICD-10-CM | POA: Diagnosis not present

## 2020-04-20 DIAGNOSIS — B070 Plantar wart: Secondary | ICD-10-CM | POA: Diagnosis not present

## 2020-04-20 DIAGNOSIS — M79672 Pain in left foot: Secondary | ICD-10-CM | POA: Diagnosis not present

## 2020-04-20 NOTE — Telephone Encounter (Signed)
Patient aware of results.

## 2020-04-23 DIAGNOSIS — H524 Presbyopia: Secondary | ICD-10-CM | POA: Diagnosis not present

## 2020-05-25 ENCOUNTER — Ambulatory Visit: Payer: BC Managed Care – PPO | Admitting: Family Medicine

## 2020-06-08 ENCOUNTER — Ambulatory Visit: Payer: BC Managed Care – PPO | Admitting: Family Medicine

## 2020-06-14 ENCOUNTER — Other Ambulatory Visit: Payer: Self-pay | Admitting: Family Medicine

## 2020-06-18 ENCOUNTER — Other Ambulatory Visit: Payer: Self-pay | Admitting: Family Medicine

## 2020-06-18 DIAGNOSIS — E7849 Other hyperlipidemia: Secondary | ICD-10-CM

## 2020-06-18 DIAGNOSIS — K219 Gastro-esophageal reflux disease without esophagitis: Secondary | ICD-10-CM

## 2020-06-22 ENCOUNTER — Ambulatory Visit: Payer: BC Managed Care – PPO | Admitting: Family Medicine

## 2020-07-02 ENCOUNTER — Other Ambulatory Visit: Payer: Self-pay | Admitting: Family Medicine

## 2020-07-02 DIAGNOSIS — K219 Gastro-esophageal reflux disease without esophagitis: Secondary | ICD-10-CM

## 2020-07-02 DIAGNOSIS — E7849 Other hyperlipidemia: Secondary | ICD-10-CM

## 2020-08-20 ENCOUNTER — Other Ambulatory Visit: Payer: Self-pay | Admitting: Family Medicine

## 2020-08-20 DIAGNOSIS — I10 Essential (primary) hypertension: Secondary | ICD-10-CM

## 2020-08-25 ENCOUNTER — Other Ambulatory Visit: Payer: Self-pay | Admitting: Family Medicine

## 2020-08-25 DIAGNOSIS — E7849 Other hyperlipidemia: Secondary | ICD-10-CM

## 2020-08-25 DIAGNOSIS — K219 Gastro-esophageal reflux disease without esophagitis: Secondary | ICD-10-CM

## 2020-10-08 ENCOUNTER — Other Ambulatory Visit: Payer: Self-pay | Admitting: Family Medicine

## 2020-10-08 DIAGNOSIS — I10 Essential (primary) hypertension: Secondary | ICD-10-CM

## 2020-11-05 ENCOUNTER — Other Ambulatory Visit: Payer: Self-pay | Admitting: Family Medicine

## 2020-11-05 DIAGNOSIS — I10 Essential (primary) hypertension: Secondary | ICD-10-CM

## 2020-11-08 NOTE — Telephone Encounter (Signed)
Patient states he will need to check work schedule and call back to schedule appointment

## 2020-11-08 NOTE — Telephone Encounter (Signed)
Stacks NTBS 6 mos ckup was to be May. Mail order not sent

## 2020-11-12 ENCOUNTER — Other Ambulatory Visit: Payer: Self-pay | Admitting: Family Medicine

## 2020-11-12 DIAGNOSIS — E7849 Other hyperlipidemia: Secondary | ICD-10-CM

## 2020-11-12 DIAGNOSIS — K219 Gastro-esophageal reflux disease without esophagitis: Secondary | ICD-10-CM

## 2020-11-15 NOTE — Telephone Encounter (Signed)
Stacks NTBS last OV in Nov mail order not sent

## 2020-11-15 NOTE — Telephone Encounter (Signed)
No answer

## 2020-12-24 ENCOUNTER — Other Ambulatory Visit: Payer: Self-pay | Admitting: Family Medicine

## 2020-12-24 DIAGNOSIS — I10 Essential (primary) hypertension: Secondary | ICD-10-CM

## 2020-12-27 NOTE — Telephone Encounter (Signed)
Stacks. NTBS last OV 04/13/20 mail order not sent

## 2021-01-18 DIAGNOSIS — U071 COVID-19: Secondary | ICD-10-CM | POA: Diagnosis not present

## 2021-01-18 DIAGNOSIS — Z20822 Contact with and (suspected) exposure to covid-19: Secondary | ICD-10-CM | POA: Diagnosis not present

## 2021-01-27 DIAGNOSIS — J329 Chronic sinusitis, unspecified: Secondary | ICD-10-CM | POA: Diagnosis not present

## 2021-01-27 DIAGNOSIS — J310 Chronic rhinitis: Secondary | ICD-10-CM | POA: Diagnosis not present

## 2021-04-12 DIAGNOSIS — Z0000 Encounter for general adult medical examination without abnormal findings: Secondary | ICD-10-CM | POA: Diagnosis not present

## 2021-04-12 DIAGNOSIS — I259 Chronic ischemic heart disease, unspecified: Secondary | ICD-10-CM | POA: Diagnosis not present

## 2021-04-12 DIAGNOSIS — L219 Seborrheic dermatitis, unspecified: Secondary | ICD-10-CM | POA: Diagnosis not present

## 2021-04-12 DIAGNOSIS — M7121 Synovial cyst of popliteal space [Baker], right knee: Secondary | ICD-10-CM | POA: Diagnosis not present

## 2021-04-12 DIAGNOSIS — R350 Frequency of micturition: Secondary | ICD-10-CM | POA: Diagnosis not present

## 2021-04-12 DIAGNOSIS — Z131 Encounter for screening for diabetes mellitus: Secondary | ICD-10-CM | POA: Diagnosis not present

## 2021-04-12 DIAGNOSIS — Z23 Encounter for immunization: Secondary | ICD-10-CM | POA: Diagnosis not present
